# Patient Record
Sex: Female | Born: 1945 | Race: Black or African American | Hispanic: No | Marital: Married | Smoking: Never smoker
Health system: Southern US, Community
[De-identification: ages and names within clinical notes are randomized; demographics above are authoritative.]

## PROBLEM LIST (undated history)

## (undated) HISTORY — PX: BREAST SURGERY: SHX581

---

## 1985-05-21 DIAGNOSIS — C50919 Malignant neoplasm of unspecified site of unspecified female breast: Secondary | ICD-10-CM

## 1985-05-21 HISTORY — DX: Malignant neoplasm of unspecified site of unspecified female breast: C50.919

## 1985-05-21 HISTORY — PX: BREAST SURGERY: SHX581

## 2011-09-27 ENCOUNTER — Ambulatory Visit (INDEPENDENT_AMBULATORY_CARE_PROVIDER_SITE_OTHER): Payer: Medicare HMO | Admitting: Orthopedic Surgery

## 2011-09-27 ENCOUNTER — Other Ambulatory Visit: Payer: Self-pay | Admitting: Orthopedic Surgery

## 2011-09-27 ENCOUNTER — Encounter: Payer: Self-pay | Admitting: Orthopedic Surgery

## 2011-09-27 ENCOUNTER — Ambulatory Visit (HOSPITAL_COMMUNITY)
Admission: RE | Admit: 2011-09-27 | Discharge: 2011-09-27 | Disposition: A | Discharge status: Home / Self Care | Payer: Medicare HMO | Source: Ambulatory Visit | Attending: Orthopedic Surgery | Admitting: Orthopedic Surgery

## 2011-09-27 VITALS — BP 110/70 | Ht 63.0 in | Wt 142.0 lb

## 2011-09-27 DIAGNOSIS — M1710 Unilateral primary osteoarthritis, unspecified knee: Secondary | ICD-10-CM | POA: Insufficient documentation

## 2011-09-27 DIAGNOSIS — M25561 Pain in right knee: Secondary | ICD-10-CM

## 2011-09-27 DIAGNOSIS — M179 Osteoarthritis of knee, unspecified: Secondary | ICD-10-CM | POA: Insufficient documentation

## 2011-09-27 NOTE — Patient Instructions (Signed)
You have received a steroid shot. 15% of patients experience increased pain at the injection site with in the next 24 hours. This is best treated with ice and tylenol extra strength 2 tabs every 8 hours. If you are still having pain please call the office.    

## 2011-09-27 NOTE — Progress Notes (Signed)
  Subjective:    Donna Graham is a 66 y.o. female who presents  with knee pain involving the right knee. Onset was gradual, starting about 4 months ago. Inciting event: none known. Current symptoms include: stiffness and swelling. Pain is aggravated by inactivity. Patient has had no prior knee problems. Evaluation to date: none. Treatment to date: none.  The following portions of the patient's history were reviewed and updated as appropriate: allergies, current medications, past family history, past medical history, past social history, past surgical history and problem list.    ROS NEGATIVE EXCEPT FOR HEARTBURN  Review of Systems Pertinent items are noted in HPI.   Objective:    BP 110/70  Ht 5\' 3"  (1.6 m)  Wt 142 lb (64.411 kg)  BMI 25.15 kg/m2  Vital signs are stable as recorded  General appearance is normal  The patient is alert and oriented x3  The patient's mood and affect are normal  Gait assessment: NORMAL  The cardiovascular exam reveals normal pulses and temperature without edema swelling.  The lymphatic system is negative for palpable lymph nodes  The sensory exam is normal.  There are no pathologic reflexes.  Balance is normal.  SKIN NORMAL   Upper extremity exam  Inspection and palpation revealed no abnormalities in the upper extremities.  Range of motion is full without contracture.  Motor exam is normal with grade 5 strength.  The joints are fully reduced without subluxation.  There is no atrophy or tremor and muscle tone is normal.  All joints are stable.  Upper extremity exam  Inspection and palpation revealed no abnormalities in the upper extremities.  Range of motion is full without contracture.  Motor exam is normal with grade 5 strength.  The joints are fully reduced without subluxation.  There is no atrophy or tremor and muscle tone is normal.  All joints are stable.     Right knee: positive exam findings: crepitus, medial joint  line tenderness and tenderness noted medial, Joint line and negative exam findings: no effusion, no erythema, ACL stable, PCL stable, MCL stable, LCL stable, no patellar laxity, McMurray's negative and FROM  Left knee:  normal and no effusion, full active range of motion, no joint line tenderness, ligamentous structures intact.   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 Arthrocentesis. See procedure note. Plain film x-rays. Follow up in PRN .   Knee  Injection Procedure Note  Knee  Injection Procedure Note  Pre-operative Diagnosis: right knee oa  Post-operative Diagnosis: same  Indications: pain  Anesthesia: ethyl chloride   Procedure Details   Verbal consent was obtained for the procedure. Time out was completed.The joint was prepped with alcohol, followed by  Ethyl chloride spray and A 20 gauge needle was inserted into the knee via lateral approach; 4ml 1% lidocaine and 1 ml of depomedrol  was then injected into the joint . The needle was removed and the area cleansed and dressed.  Complications:  None; patient tolerated the procedure well.

## 2014-05-08 IMAGING — CR DG KNEE AP/LAT W/ SUNRISE*R*
3 series · 3 of 3 positions shown · non-contrast
Comparison: None

CLINICAL DATA: Medial right knee pain for 4 months

DG KNEE - 3 VIEWS

[view not recorded (1 of 3)]
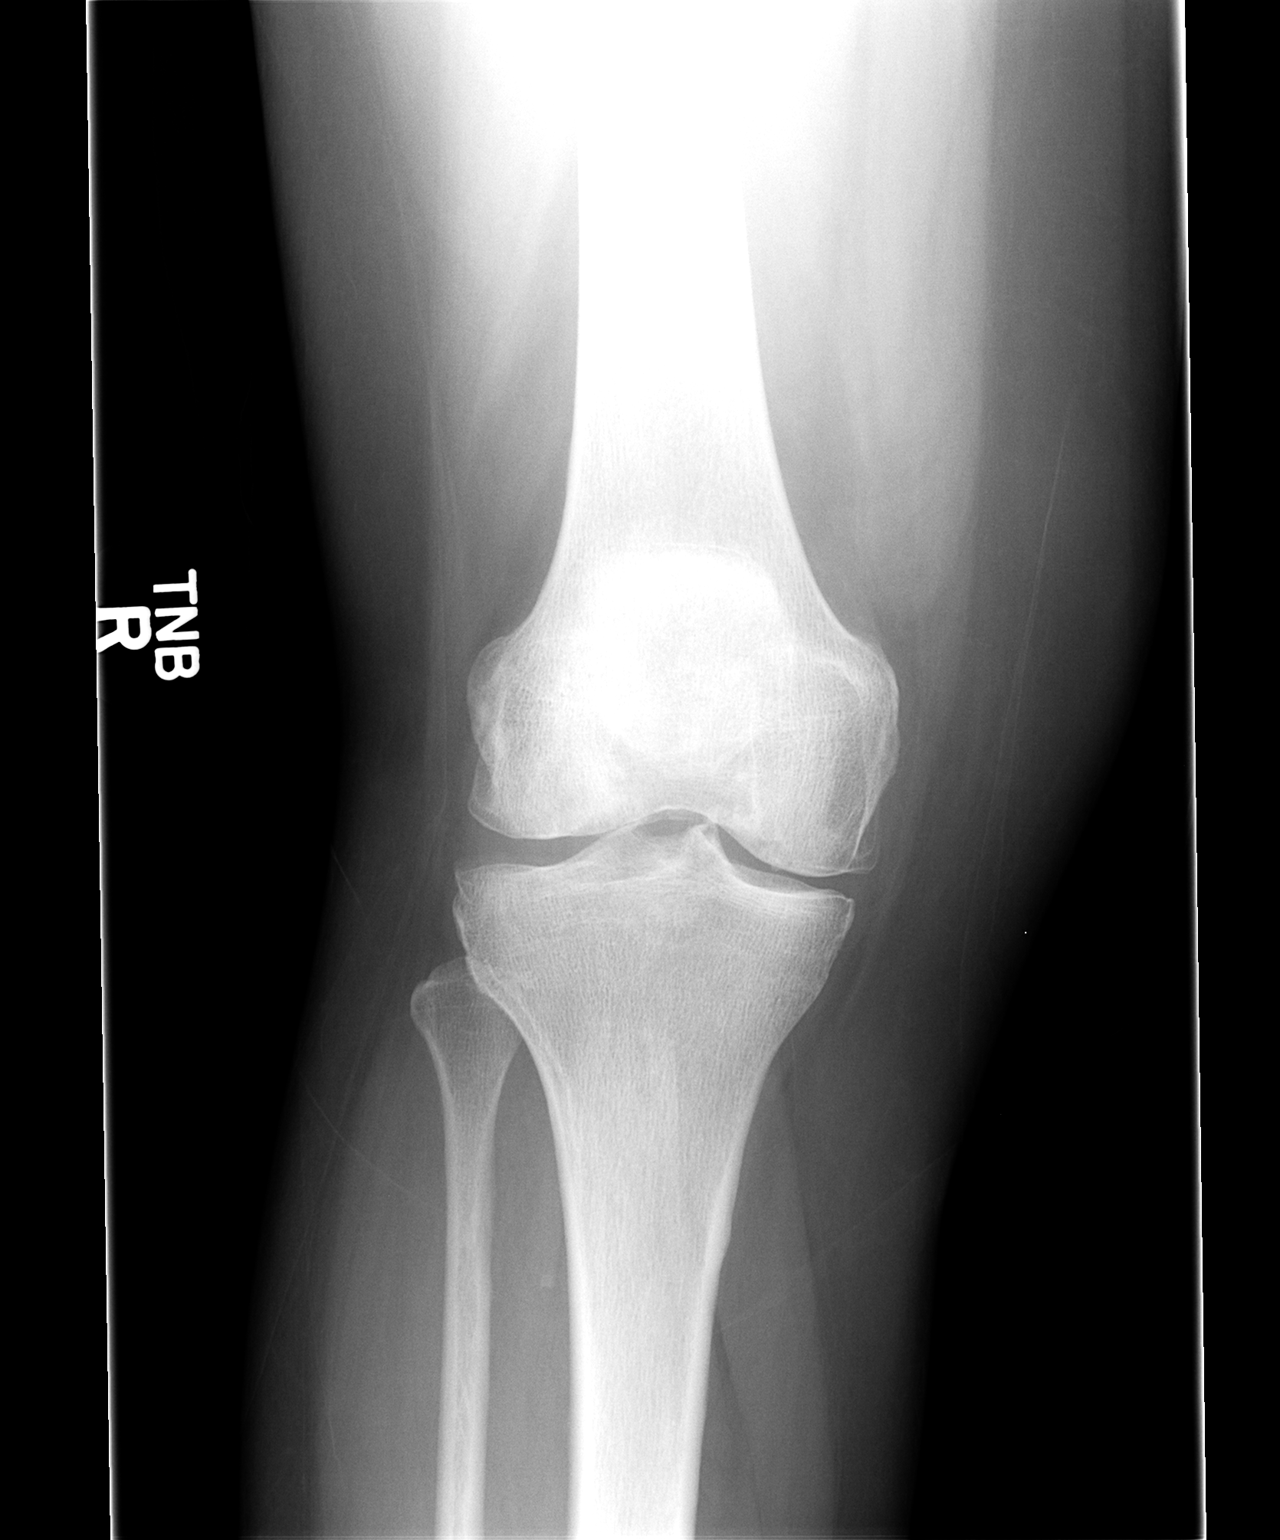

[view not recorded (2 of 3)]
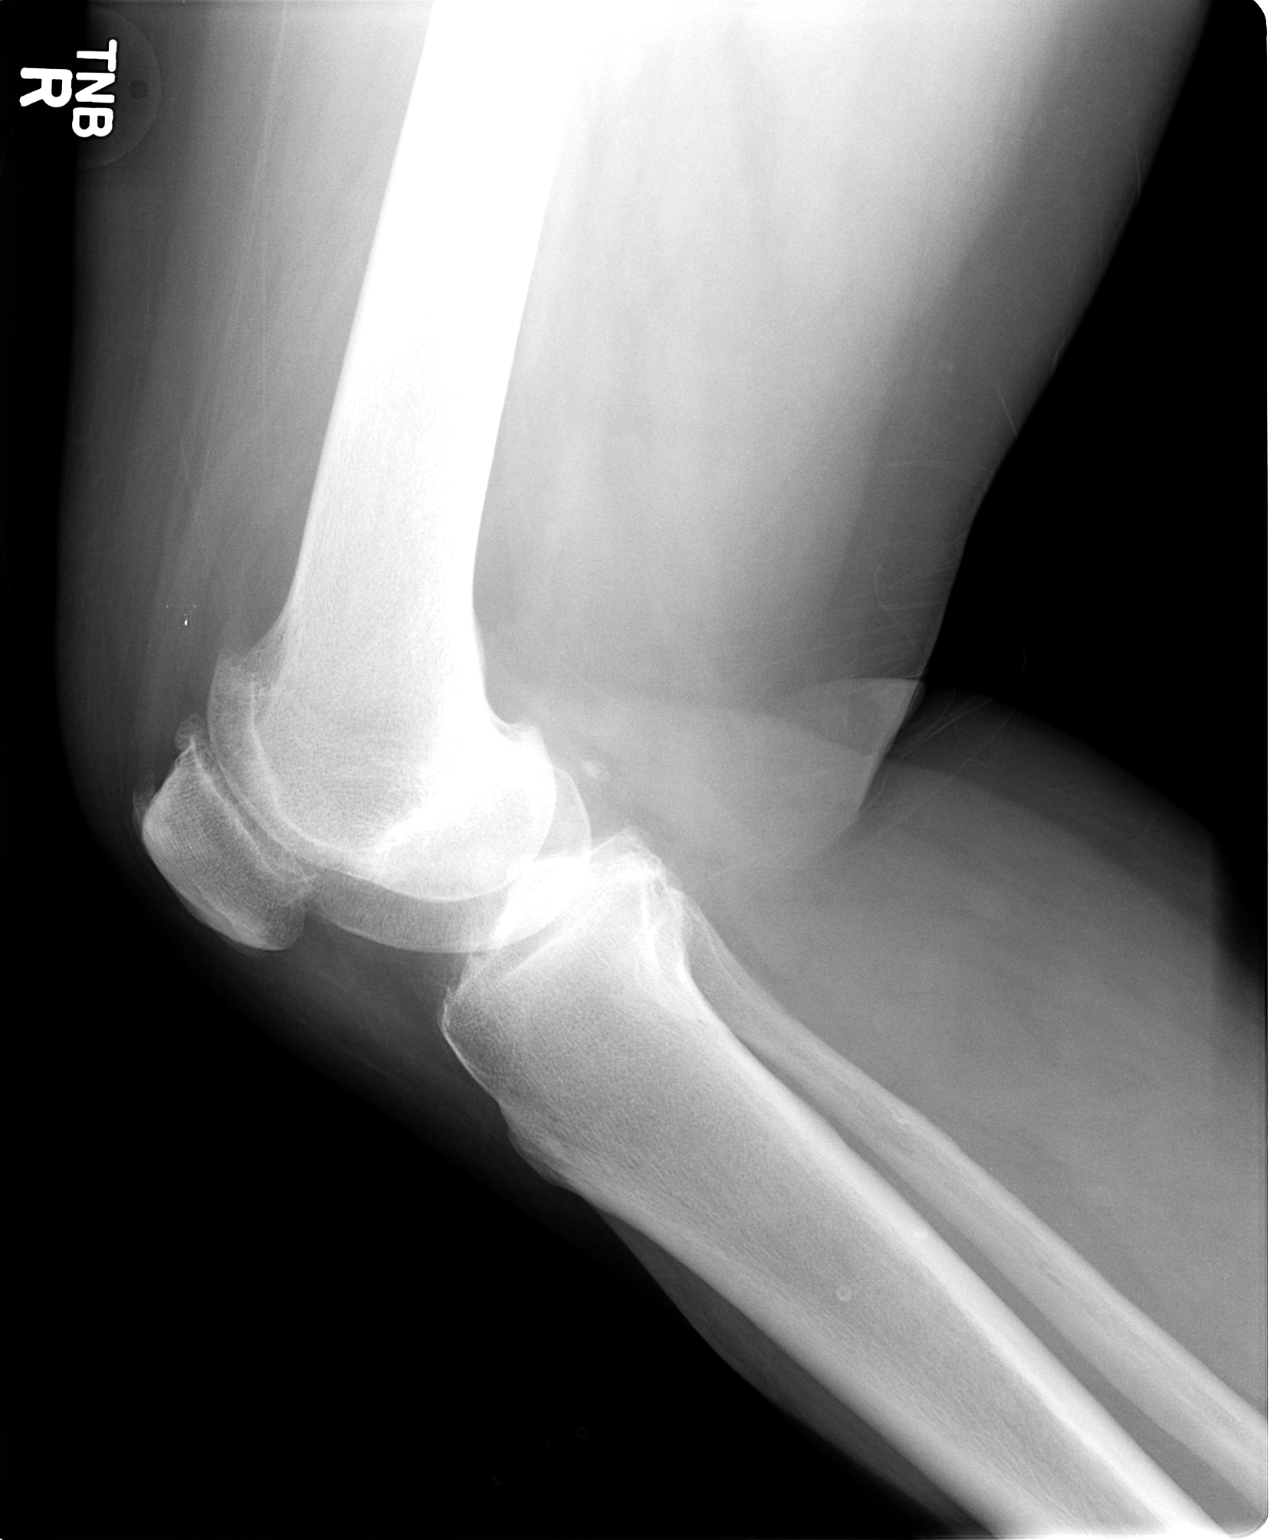

[view not recorded (3 of 3)]
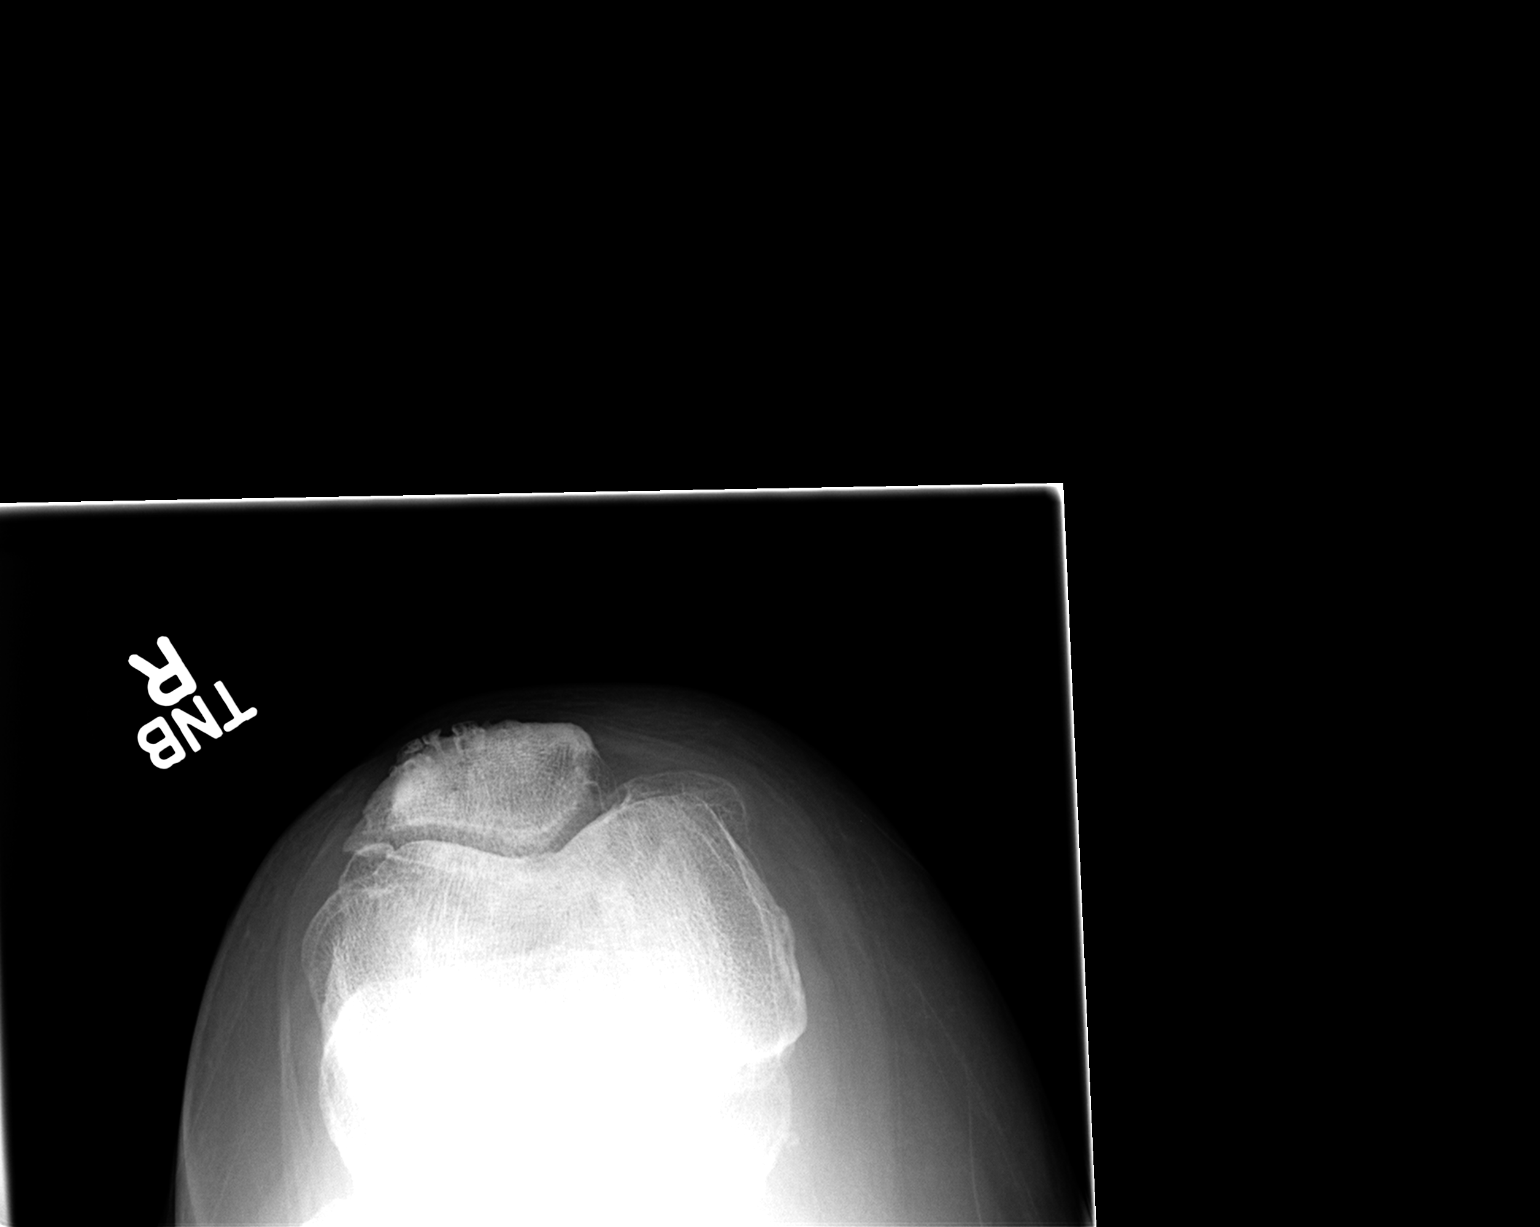

[3 of 3 positions shown; findings below may reference images not displayed]

FINDINGS: Osteoarthritic changes right knee greater at medial compartment and
patellofemoral joint than lateral compartment.
Joint space narrowing and marginal spur formation are seen.
No acute fracture, dislocation or bone destruction.
No knee joint effusion.
IMPRESSION: Tricompartmental osteoarthritic changes right knee.

## 2016-06-19 DIAGNOSIS — E2839 Other primary ovarian failure: Secondary | ICD-10-CM | POA: Diagnosis not present

## 2016-08-29 DIAGNOSIS — M778 Other enthesopathies, not elsewhere classified: Secondary | ICD-10-CM | POA: Diagnosis not present

## 2016-08-29 DIAGNOSIS — Z713 Dietary counseling and surveillance: Secondary | ICD-10-CM | POA: Diagnosis not present

## 2016-08-29 DIAGNOSIS — Z299 Encounter for prophylactic measures, unspecified: Secondary | ICD-10-CM | POA: Diagnosis not present

## 2016-08-29 DIAGNOSIS — Z6825 Body mass index (BMI) 25.0-25.9, adult: Secondary | ICD-10-CM | POA: Diagnosis not present

## 2016-08-29 DIAGNOSIS — Z2821 Immunization not carried out because of patient refusal: Secondary | ICD-10-CM | POA: Diagnosis not present

## 2016-08-29 DIAGNOSIS — M19031 Primary osteoarthritis, right wrist: Secondary | ICD-10-CM | POA: Diagnosis not present

## 2016-10-11 DIAGNOSIS — Z6825 Body mass index (BMI) 25.0-25.9, adult: Secondary | ICD-10-CM | POA: Diagnosis not present

## 2016-10-11 DIAGNOSIS — E559 Vitamin D deficiency, unspecified: Secondary | ICD-10-CM | POA: Diagnosis not present

## 2016-10-11 DIAGNOSIS — M19039 Primary osteoarthritis, unspecified wrist: Secondary | ICD-10-CM | POA: Diagnosis not present

## 2016-10-11 DIAGNOSIS — Z299 Encounter for prophylactic measures, unspecified: Secondary | ICD-10-CM | POA: Diagnosis not present

## 2016-10-11 DIAGNOSIS — E7800 Pure hypercholesterolemia, unspecified: Secondary | ICD-10-CM | POA: Diagnosis not present

## 2017-05-06 DIAGNOSIS — Z1231 Encounter for screening mammogram for malignant neoplasm of breast: Secondary | ICD-10-CM | POA: Diagnosis not present

## 2017-05-17 DIAGNOSIS — Z7189 Other specified counseling: Secondary | ICD-10-CM | POA: Diagnosis not present

## 2017-05-17 DIAGNOSIS — Z0000 Encounter for general adult medical examination without abnormal findings: Secondary | ICD-10-CM | POA: Diagnosis not present

## 2017-05-17 DIAGNOSIS — E7800 Pure hypercholesterolemia, unspecified: Secondary | ICD-10-CM | POA: Diagnosis not present

## 2017-05-17 DIAGNOSIS — Z299 Encounter for prophylactic measures, unspecified: Secondary | ICD-10-CM | POA: Diagnosis not present

## 2017-05-17 DIAGNOSIS — Z1339 Encounter for screening examination for other mental health and behavioral disorders: Secondary | ICD-10-CM | POA: Diagnosis not present

## 2017-05-17 DIAGNOSIS — Z1331 Encounter for screening for depression: Secondary | ICD-10-CM | POA: Diagnosis not present

## 2017-05-17 DIAGNOSIS — Z6827 Body mass index (BMI) 27.0-27.9, adult: Secondary | ICD-10-CM | POA: Diagnosis not present

## 2017-05-17 DIAGNOSIS — R5383 Other fatigue: Secondary | ICD-10-CM | POA: Diagnosis not present

## 2017-05-17 DIAGNOSIS — Z79899 Other long term (current) drug therapy: Secondary | ICD-10-CM | POA: Diagnosis not present

## 2017-05-17 DIAGNOSIS — Z1211 Encounter for screening for malignant neoplasm of colon: Secondary | ICD-10-CM | POA: Diagnosis not present

## 2017-05-20 DIAGNOSIS — E7800 Pure hypercholesterolemia, unspecified: Secondary | ICD-10-CM | POA: Diagnosis not present

## 2017-05-20 DIAGNOSIS — R5383 Other fatigue: Secondary | ICD-10-CM | POA: Diagnosis not present

## 2017-05-20 DIAGNOSIS — Z0000 Encounter for general adult medical examination without abnormal findings: Secondary | ICD-10-CM | POA: Diagnosis not present

## 2017-05-20 DIAGNOSIS — E559 Vitamin D deficiency, unspecified: Secondary | ICD-10-CM | POA: Diagnosis not present

## 2017-05-20 DIAGNOSIS — Z79899 Other long term (current) drug therapy: Secondary | ICD-10-CM | POA: Diagnosis not present

## 2017-06-26 DIAGNOSIS — E782 Mixed hyperlipidemia: Secondary | ICD-10-CM | POA: Diagnosis not present

## 2017-06-26 DIAGNOSIS — Z6826 Body mass index (BMI) 26.0-26.9, adult: Secondary | ICD-10-CM | POA: Diagnosis not present

## 2017-07-09 DIAGNOSIS — Z6826 Body mass index (BMI) 26.0-26.9, adult: Secondary | ICD-10-CM | POA: Diagnosis not present

## 2017-07-09 DIAGNOSIS — R05 Cough: Secondary | ICD-10-CM | POA: Diagnosis not present

## 2017-08-01 DIAGNOSIS — H6121 Impacted cerumen, right ear: Secondary | ICD-10-CM | POA: Diagnosis not present

## 2017-10-15 DIAGNOSIS — Z0100 Encounter for examination of eyes and vision without abnormal findings: Secondary | ICD-10-CM | POA: Diagnosis not present

## 2017-10-15 DIAGNOSIS — H40053 Ocular hypertension, bilateral: Secondary | ICD-10-CM | POA: Diagnosis not present

## 2018-03-31 DIAGNOSIS — R69 Illness, unspecified: Secondary | ICD-10-CM | POA: Diagnosis not present

## 2018-04-08 DIAGNOSIS — Z6828 Body mass index (BMI) 28.0-28.9, adult: Secondary | ICD-10-CM | POA: Diagnosis not present

## 2018-04-08 DIAGNOSIS — R202 Paresthesia of skin: Secondary | ICD-10-CM | POA: Diagnosis not present

## 2018-04-25 DIAGNOSIS — Z6828 Body mass index (BMI) 28.0-28.9, adult: Secondary | ICD-10-CM | POA: Diagnosis not present

## 2018-04-25 DIAGNOSIS — M25562 Pain in left knee: Secondary | ICD-10-CM | POA: Diagnosis not present

## 2018-05-08 DIAGNOSIS — Z1231 Encounter for screening mammogram for malignant neoplasm of breast: Secondary | ICD-10-CM | POA: Diagnosis not present

## 2018-05-30 DIAGNOSIS — R69 Illness, unspecified: Secondary | ICD-10-CM | POA: Diagnosis not present

## 2018-06-10 DIAGNOSIS — M1711 Unilateral primary osteoarthritis, right knee: Secondary | ICD-10-CM | POA: Diagnosis not present

## 2018-06-10 DIAGNOSIS — M1712 Unilateral primary osteoarthritis, left knee: Secondary | ICD-10-CM | POA: Diagnosis not present

## 2018-06-10 DIAGNOSIS — Z6827 Body mass index (BMI) 27.0-27.9, adult: Secondary | ICD-10-CM | POA: Diagnosis not present

## 2018-09-01 DIAGNOSIS — H00011 Hordeolum externum right upper eyelid: Secondary | ICD-10-CM | POA: Diagnosis not present

## 2018-09-17 DIAGNOSIS — G5790 Unspecified mononeuropathy of unspecified lower limb: Secondary | ICD-10-CM | POA: Diagnosis not present

## 2018-09-17 DIAGNOSIS — M79671 Pain in right foot: Secondary | ICD-10-CM | POA: Diagnosis not present

## 2018-09-17 DIAGNOSIS — G600 Hereditary motor and sensory neuropathy: Secondary | ICD-10-CM | POA: Diagnosis not present

## 2018-09-17 DIAGNOSIS — M792 Neuralgia and neuritis, unspecified: Secondary | ICD-10-CM | POA: Diagnosis not present

## 2018-09-17 DIAGNOSIS — M79672 Pain in left foot: Secondary | ICD-10-CM | POA: Diagnosis not present

## 2018-10-06 DIAGNOSIS — M1712 Unilateral primary osteoarthritis, left knee: Secondary | ICD-10-CM | POA: Diagnosis not present

## 2018-10-14 DIAGNOSIS — R5383 Other fatigue: Secondary | ICD-10-CM | POA: Diagnosis not present

## 2018-10-14 DIAGNOSIS — M1711 Unilateral primary osteoarthritis, right knee: Secondary | ICD-10-CM | POA: Diagnosis not present

## 2018-10-14 DIAGNOSIS — Z6827 Body mass index (BMI) 27.0-27.9, adult: Secondary | ICD-10-CM | POA: Diagnosis not present

## 2018-10-14 DIAGNOSIS — Z0001 Encounter for general adult medical examination with abnormal findings: Secondary | ICD-10-CM | POA: Diagnosis not present

## 2018-10-14 DIAGNOSIS — E782 Mixed hyperlipidemia: Secondary | ICD-10-CM | POA: Diagnosis not present

## 2018-10-14 DIAGNOSIS — Z1212 Encounter for screening for malignant neoplasm of rectum: Secondary | ICD-10-CM | POA: Diagnosis not present

## 2018-10-14 DIAGNOSIS — M1712 Unilateral primary osteoarthritis, left knee: Secondary | ICD-10-CM | POA: Diagnosis not present

## 2018-10-14 DIAGNOSIS — E039 Hypothyroidism, unspecified: Secondary | ICD-10-CM | POA: Diagnosis not present

## 2018-11-12 DIAGNOSIS — H0011 Chalazion right upper eyelid: Secondary | ICD-10-CM | POA: Diagnosis not present

## 2018-12-19 DIAGNOSIS — M1711 Unilateral primary osteoarthritis, right knee: Secondary | ICD-10-CM | POA: Diagnosis not present

## 2018-12-19 DIAGNOSIS — E782 Mixed hyperlipidemia: Secondary | ICD-10-CM | POA: Diagnosis not present

## 2019-01-19 DIAGNOSIS — I10 Essential (primary) hypertension: Secondary | ICD-10-CM | POA: Diagnosis not present

## 2019-01-19 DIAGNOSIS — E1165 Type 2 diabetes mellitus with hyperglycemia: Secondary | ICD-10-CM | POA: Diagnosis not present

## 2019-03-04 DIAGNOSIS — Z23 Encounter for immunization: Secondary | ICD-10-CM | POA: Diagnosis not present

## 2019-06-30 DIAGNOSIS — R69 Illness, unspecified: Secondary | ICD-10-CM | POA: Diagnosis not present

## 2019-08-19 DIAGNOSIS — I10 Essential (primary) hypertension: Secondary | ICD-10-CM | POA: Diagnosis not present

## 2019-08-19 DIAGNOSIS — E7849 Other hyperlipidemia: Secondary | ICD-10-CM | POA: Diagnosis not present

## 2019-09-03 DIAGNOSIS — Z6827 Body mass index (BMI) 27.0-27.9, adult: Secondary | ICD-10-CM | POA: Diagnosis not present

## 2019-09-03 DIAGNOSIS — H6690 Otitis media, unspecified, unspecified ear: Secondary | ICD-10-CM | POA: Diagnosis not present

## 2019-09-03 DIAGNOSIS — H6121 Impacted cerumen, right ear: Secondary | ICD-10-CM | POA: Diagnosis not present

## 2019-09-07 DIAGNOSIS — R69 Illness, unspecified: Secondary | ICD-10-CM | POA: Diagnosis not present

## 2019-09-25 DIAGNOSIS — Z9011 Acquired absence of right breast and nipple: Secondary | ICD-10-CM | POA: Diagnosis not present

## 2019-09-25 DIAGNOSIS — Z1231 Encounter for screening mammogram for malignant neoplasm of breast: Secondary | ICD-10-CM | POA: Diagnosis not present

## 2019-11-09 DIAGNOSIS — M1711 Unilateral primary osteoarthritis, right knee: Secondary | ICD-10-CM | POA: Diagnosis not present

## 2019-12-15 DIAGNOSIS — M7661 Achilles tendinitis, right leg: Secondary | ICD-10-CM | POA: Diagnosis not present

## 2019-12-15 DIAGNOSIS — M7731 Calcaneal spur, right foot: Secondary | ICD-10-CM | POA: Diagnosis not present

## 2019-12-15 DIAGNOSIS — M79671 Pain in right foot: Secondary | ICD-10-CM | POA: Diagnosis not present

## 2020-02-01 DIAGNOSIS — R5382 Chronic fatigue, unspecified: Secondary | ICD-10-CM | POA: Diagnosis not present

## 2020-02-01 DIAGNOSIS — R202 Paresthesia of skin: Secondary | ICD-10-CM | POA: Diagnosis not present

## 2020-02-01 DIAGNOSIS — E782 Mixed hyperlipidemia: Secondary | ICD-10-CM | POA: Diagnosis not present

## 2020-02-05 DIAGNOSIS — Z1212 Encounter for screening for malignant neoplasm of rectum: Secondary | ICD-10-CM | POA: Diagnosis not present

## 2020-02-05 DIAGNOSIS — Z0001 Encounter for general adult medical examination with abnormal findings: Secondary | ICD-10-CM | POA: Diagnosis not present

## 2020-02-05 DIAGNOSIS — M1711 Unilateral primary osteoarthritis, right knee: Secondary | ICD-10-CM | POA: Diagnosis not present

## 2020-02-05 DIAGNOSIS — Z6827 Body mass index (BMI) 27.0-27.9, adult: Secondary | ICD-10-CM | POA: Diagnosis not present

## 2020-02-05 DIAGNOSIS — E782 Mixed hyperlipidemia: Secondary | ICD-10-CM | POA: Diagnosis not present

## 2020-02-05 DIAGNOSIS — M1712 Unilateral primary osteoarthritis, left knee: Secondary | ICD-10-CM | POA: Diagnosis not present

## 2020-04-27 DIAGNOSIS — M9903 Segmental and somatic dysfunction of lumbar region: Secondary | ICD-10-CM | POA: Diagnosis not present

## 2020-04-27 DIAGNOSIS — S335XXA Sprain of ligaments of lumbar spine, initial encounter: Secondary | ICD-10-CM | POA: Diagnosis not present

## 2020-05-02 DIAGNOSIS — M9903 Segmental and somatic dysfunction of lumbar region: Secondary | ICD-10-CM | POA: Diagnosis not present

## 2020-05-02 DIAGNOSIS — S335XXA Sprain of ligaments of lumbar spine, initial encounter: Secondary | ICD-10-CM | POA: Diagnosis not present

## 2020-05-10 DIAGNOSIS — S335XXA Sprain of ligaments of lumbar spine, initial encounter: Secondary | ICD-10-CM | POA: Diagnosis not present

## 2020-05-10 DIAGNOSIS — M9903 Segmental and somatic dysfunction of lumbar region: Secondary | ICD-10-CM | POA: Diagnosis not present

## 2020-12-23 DIAGNOSIS — Z1231 Encounter for screening mammogram for malignant neoplasm of breast: Secondary | ICD-10-CM | POA: Diagnosis not present

## 2021-02-03 DIAGNOSIS — Z1329 Encounter for screening for other suspected endocrine disorder: Secondary | ICD-10-CM | POA: Diagnosis not present

## 2021-02-03 DIAGNOSIS — E782 Mixed hyperlipidemia: Secondary | ICD-10-CM | POA: Diagnosis not present

## 2021-02-03 DIAGNOSIS — R5382 Chronic fatigue, unspecified: Secondary | ICD-10-CM | POA: Diagnosis not present

## 2021-02-03 DIAGNOSIS — Z1159 Encounter for screening for other viral diseases: Secondary | ICD-10-CM | POA: Diagnosis not present

## 2021-02-03 DIAGNOSIS — E7849 Other hyperlipidemia: Secondary | ICD-10-CM | POA: Diagnosis not present

## 2021-02-08 DIAGNOSIS — R7301 Impaired fasting glucose: Secondary | ICD-10-CM | POA: Diagnosis not present

## 2021-02-08 DIAGNOSIS — Z0001 Encounter for general adult medical examination with abnormal findings: Secondary | ICD-10-CM | POA: Diagnosis not present

## 2021-02-08 DIAGNOSIS — Z6827 Body mass index (BMI) 27.0-27.9, adult: Secondary | ICD-10-CM | POA: Diagnosis not present

## 2021-02-08 DIAGNOSIS — M1711 Unilateral primary osteoarthritis, right knee: Secondary | ICD-10-CM | POA: Diagnosis not present

## 2021-02-08 DIAGNOSIS — M1712 Unilateral primary osteoarthritis, left knee: Secondary | ICD-10-CM | POA: Diagnosis not present

## 2021-02-08 DIAGNOSIS — E7849 Other hyperlipidemia: Secondary | ICD-10-CM | POA: Diagnosis not present

## 2021-02-15 DIAGNOSIS — J069 Acute upper respiratory infection, unspecified: Secondary | ICD-10-CM | POA: Diagnosis not present

## 2021-02-15 DIAGNOSIS — Z20828 Contact with and (suspected) exposure to other viral communicable diseases: Secondary | ICD-10-CM | POA: Diagnosis not present

## 2021-11-08 DIAGNOSIS — H6091 Unspecified otitis externa, right ear: Secondary | ICD-10-CM | POA: Diagnosis not present

## 2021-11-08 DIAGNOSIS — R030 Elevated blood-pressure reading, without diagnosis of hypertension: Secondary | ICD-10-CM | POA: Diagnosis not present

## 2021-12-25 DIAGNOSIS — Z1231 Encounter for screening mammogram for malignant neoplasm of breast: Secondary | ICD-10-CM | POA: Diagnosis not present

## 2022-01-05 ENCOUNTER — Other Ambulatory Visit: Payer: Self-pay | Admitting: Family Medicine

## 2022-01-05 DIAGNOSIS — R921 Mammographic calcification found on diagnostic imaging of breast: Secondary | ICD-10-CM | POA: Diagnosis not present

## 2022-01-05 DIAGNOSIS — R928 Other abnormal and inconclusive findings on diagnostic imaging of breast: Secondary | ICD-10-CM | POA: Diagnosis not present

## 2022-01-16 DIAGNOSIS — R921 Mammographic calcification found on diagnostic imaging of breast: Secondary | ICD-10-CM | POA: Diagnosis not present

## 2022-01-16 DIAGNOSIS — Z6827 Body mass index (BMI) 27.0-27.9, adult: Secondary | ICD-10-CM | POA: Diagnosis not present

## 2022-01-18 ENCOUNTER — Ambulatory Visit
Admission: RE | Admit: 2022-01-18 | Discharge: 2022-01-18 | Disposition: A | Discharge status: Home / Self Care | Payer: Medicare HMO | Source: Ambulatory Visit | Attending: Family Medicine | Admitting: Family Medicine

## 2022-01-18 DIAGNOSIS — R928 Other abnormal and inconclusive findings on diagnostic imaging of breast: Secondary | ICD-10-CM

## 2022-01-18 DIAGNOSIS — R921 Mammographic calcification found on diagnostic imaging of breast: Secondary | ICD-10-CM

## 2022-01-18 DIAGNOSIS — D0512 Intraductal carcinoma in situ of left breast: Secondary | ICD-10-CM | POA: Diagnosis not present

## 2022-01-23 ENCOUNTER — Other Ambulatory Visit: Payer: Self-pay | Admitting: General Surgery

## 2022-01-23 DIAGNOSIS — I890 Lymphedema, not elsewhere classified: Secondary | ICD-10-CM | POA: Diagnosis not present

## 2022-01-23 DIAGNOSIS — Z170 Estrogen receptor positive status [ER+]: Secondary | ICD-10-CM | POA: Diagnosis not present

## 2022-01-23 DIAGNOSIS — C50212 Malignant neoplasm of upper-inner quadrant of left female breast: Secondary | ICD-10-CM | POA: Diagnosis not present

## 2022-01-23 DIAGNOSIS — Z853 Personal history of malignant neoplasm of breast: Secondary | ICD-10-CM | POA: Diagnosis not present

## 2022-01-31 ENCOUNTER — Telehealth: Payer: Self-pay | Admitting: Hematology and Oncology

## 2022-01-31 NOTE — Telephone Encounter (Signed)
Called pt to sch appt per 9/13 staff msg. No answer. Left msg for pt to call back to sch appt.

## 2022-02-01 ENCOUNTER — Telehealth: Payer: Self-pay | Admitting: Hematology and Oncology

## 2022-02-01 NOTE — Telephone Encounter (Signed)
Scheduled appt per 9/12 referral. Pt is aware of appt date and time. Pt is aware to arrive 15 mins prior to appt time and to bring and updated insurance card. Pt is aware of appt location.   

## 2022-02-05 ENCOUNTER — Other Ambulatory Visit: Payer: Self-pay

## 2022-02-05 ENCOUNTER — Encounter (HOSPITAL_COMMUNITY): Payer: Self-pay | Admitting: General Surgery

## 2022-02-05 NOTE — Progress Notes (Signed)
PCP - Dr. Gar Ponto at [facility] in [place] Cardiologist - Denies EKG -  Chest x-ray -  ECHO - Denies Cardiac Cath - Denies CPAP - Denies DM - Denies Blood Thinner Instructions: Denies Aspirin Instructions: Denies  Anesthesia review: No  -------------  SDW INSTRUCTIONS:  Your procedure is scheduled on Tuesday September 19th . Please report to [facility] Main Entrance "A" at 1230 A.M., and check in at the Admitting office. Call this number if you have problems the morning of surgery: [phone]   Remember: Do not eat  after midnight the night before your surgery  You may drink clear liquids until 1200 the morning of your surgery.   Clear liquids allowed are: Water, Non-Citrus Juices (without pulp), Carbonated Beverages, Clear Tea, Black Coffee Only, and Gatorade   Medications to take morning of surgery with a sip of water include: NONE  As of today, STOP taking any Aspirin (unless otherwise instructed by your surgeon), Aleve, Naproxen, Ibuprofen, Motrin, Advil, Goody's, BC's, all herbal medications, fish oil, and all vitamins.    The Morning of Surgery Do not wear jewelry, make-up or nail polish. Do not wear lotions, powders, or perfumes/colognes, or deodorant Do not bring valuables to the hospital. [facility] is not responsible for any belongings or valuables.    Remember that you must have someone to transport you home after your surgery, and remain with you for 24 hours if you are discharged the same day.  Please bring cases for contacts, glasses, hearing aids, dentures or bridgework because it cannot be worn into surgery.   Patients discharged the day of surgery will not be allowed to drive home.   Please shower the NIGHT BEFORE/MORNING OF SURGERY (use antibacterial soap like DIAL soap if possible). Wear comfortable clothes the morning of surgery. Oral Hygiene is also important to reduce your risk of infection.  Remember - BRUSH YOUR TEETH THE MORNING OF SURGERY  WITH YOUR REGULAR TOOTHPASTE  Patient denies shortness of breath, fever, cough and chest pain.

## 2022-02-05 NOTE — H&P (Signed)
REFERRING PHYSICIAN:  Quillian Quince   PROVIDER:  Georgianne Fick, MD   Care Team: Patient Care Team: Glenda Chroman, MD as PCP - General (Internal Medicine)    MRN: [id number] DOB: [birth year] DATE OF ENCOUNTER: 01/23/2022   Subjective    Chief Complaint: New Consultation (Left breast cancer)       History of Present Illness: Donna Graham is a 76 y.o. female who is seen today as an office consultation at the request of Dr. Quillian Quince for evaluation of New Consultation (Left breast cancer)   Patient is with a new diagnosis of left breast cancer August/2023.  She had screening detected left breast calcifications.  There was a 2 cm group of indeterminate calcifications in the upper inner quadrant on the left.  Core needle biopsy was performed and this showed intermediate grade DCIS, ER and PR positive.   Of note she has a history of right breast cancer that was taking care of in the 80s.  She got a mastectomy and an implant.  She knows that she received radiation, but she did not take antihormonal therapy or chemotherapy.   She is overall healthy and does not take medications other than vitamins.  She gets around well and has lots of family that lives nearby.       Family cancer history: mother and sister have had breast cancer     Diagnostic mammogram:01/05/2022 [facility]   ACR Breast Density Category b: There are scattered areas of fibroglandular density. FINDINGS: There is a 2 cm group of amorphous and fine pleomorphic calcifications in a linear distribution in the upper inner quadrant of the left breast. They are best seen on the CC projection. These have an indeterminate morphology. Indeterminate 2 cm group of left breast calcifications. Recommend stereotactic biopsy. RECOMMENDATION: Stereotactic biopsy of the left breast. I have discussed the findings and recommendations with the patient. If applicable, a reminder letter will be sent to the patient regarding the next  appointment.  BI-RADS CATEGORY  4: Suspicious.    Pathology core needle biopsy:  01/18/2022 [facility], left, needle core biopsy, UIQ, X clip - DUCTAL CARCINOMA IN SITU, INTERMEDIATE GRADE - NECROSIS: PRESENT, FOCAL - CALCIFICATIONS: PRESENT - DCIS LENGTH: 0.5 CM   Receptors: Estrogen receptor:  100% positive, strong staining Progesterone receptor: 100% positive, strong staining   Review of Systems: A complete review of systems was obtained from the patient.  I have reviewed this information and discussed as appropriate with the patient.  See HPI as well for other ROS.     Medical History: Past Medical History      Past Medical History:  Diagnosis Date   History of cancer             Patient Active Problem List  Diagnosis   Malignant neoplasm of upper-inner quadrant of left breast in female, estrogen receptor positive (CMS-HCC)   History of right breast cancer      Past Surgical History  History reviewed. No pertinent surgical history.      Allergies  No Known Allergies           Current Outpatient Medications on File Prior to Visit  Medication Sig Dispense Refill   multivitamin capsule Take 1 capsule by mouth once daily        No current facility-administered medications on file prior to visit.      Family History       Family History  Problem Relation Age of Onset   Breast cancer  Mother     Breast cancer Sister          Social History       Tobacco Use  Smoking Status Never  Smokeless Tobacco Never      Social History  Social History        Socioeconomic History   Marital status: Married  Tobacco Use   Smoking status: Never   Smokeless tobacco: Never  Vaping Use   Vaping Use: Never used  Substance and Sexual Activity   Alcohol use: Not Currently   Drug use: Never        Objective:         Vitals:    01/23/22 1520  BP: (!) 150/78  Pulse: 79  Temp: 36.6 C (97.9 F)  SpO2: 98%  Weight: 67.5 kg (148 lb 12.8 oz)  Height: 160  cm ('5\' 3"'$ )    Body mass index is 26.36 kg/m.   Gen:  No acute distress.  Well nourished and well groomed.   Neurological: Alert and oriented to person, place, and time. Coordination normal.  Head: Normocephalic and atraumatic.  Eyes: Conjunctivae are normal. Pupils are equal, round, and reactive to light. No scleral icterus.  Neck: Normal range of motion. Neck supple. No tracheal deviation or thyromegaly present.  Cardiovascular: Normal rate, regular rhythm, normal heart sounds and intact distal pulses.  Exam reveals no gallop and no friction rub.  No murmur heard. Breast: Right breast surgically absent with implant.  No palpable masses over this and no lymphadenopathy present.  There is no arm lymphedema and good range of motion on the right.  Left breast has no significant bruising and no palpable masses.  There is no nipple retraction or nipple discharge.  She has no skin dimpling or alteration of left breast contour.  Left axilla is negative as well. Respiratory: Effort normal.  No respiratory distress. No chest wall tenderness. Breath sounds normal.  No wheezes, rales or rhonchi.  GI: Soft. Bowel sounds are normal. The abdomen is soft and nontender.  There is no rebound and no guarding.  Musculoskeletal: Normal range of motion. Extremities are nontender.  Lymphadenopathy: No cervical, preauricular, postauricular or axillary adenopathy is present Skin: Skin is warm and dry. No rash noted. No diaphoresis. No erythema. No pallor. No clubbing, cyanosis, or edema.   Psychiatric: Normal mood and affect. Behavior is normal. Judgment and thought content normal.      Labs None avail   Assessment and Plan:        ICD-10-CM    1. Malignant neoplasm of upper-inner quadrant of left breast in female, estrogen receptor positive (CMS-HCC)  C50.212      Z17.0       2. History of right breast cancer  Z85.3         Surgical options were discussed with the patient's new diagnosis of clinical Tis  left breast cancer.  She is certainly candidate for a lumpectomy, but the patient desires mastectomy.  She declines referral to discuss reconstruction.   I reviewed mastectomy with the patient.  I discussed that she would get a preoperative block and then be put to sleep.  I will inject her with mag trace in the event that invasive cancer is found.  I discussed the incision and the boundaries of the breast.  I reviewed risks of surgery including bleeding, infection, chronic pain, dissatisfaction with the scar, discomfort, decreased range of motion, numbness or tingling in her chest wall or arm, heart or lung  complications, blood clot, others.  I discussed recovery and drain placement.  She does remember the drain from before and with several of her other relatives.   She would like to do this at the first available opportunity.     Pt desires oncology referral in [place].

## 2022-02-06 ENCOUNTER — Ambulatory Visit (HOSPITAL_COMMUNITY): Payer: Medicare HMO | Admitting: Anesthesiology

## 2022-02-06 ENCOUNTER — Ambulatory Visit (HOSPITAL_COMMUNITY)
Admission: RE | Admit: 2022-02-06 | Discharge: 2022-02-07 | Disposition: A | Discharge status: Home / Self Care | Payer: Medicare HMO | Attending: General Surgery | Admitting: General Surgery

## 2022-02-06 ENCOUNTER — Encounter (HOSPITAL_COMMUNITY): Payer: Self-pay | Admitting: General Surgery

## 2022-02-06 ENCOUNTER — Ambulatory Visit (HOSPITAL_BASED_OUTPATIENT_CLINIC_OR_DEPARTMENT_OTHER): Payer: Medicare HMO | Admitting: Anesthesiology

## 2022-02-06 ENCOUNTER — Encounter (HOSPITAL_COMMUNITY)
Admission: RE | Disposition: A | Discharge status: Home / Self Care | Payer: Self-pay | Source: Home / Self Care | Attending: General Surgery

## 2022-02-06 ENCOUNTER — Other Ambulatory Visit: Payer: Self-pay

## 2022-02-06 DIAGNOSIS — D0512 Intraductal carcinoma in situ of left breast: Secondary | ICD-10-CM

## 2022-02-06 DIAGNOSIS — N6012 Diffuse cystic mastopathy of left breast: Secondary | ICD-10-CM | POA: Diagnosis not present

## 2022-02-06 DIAGNOSIS — Z803 Family history of malignant neoplasm of breast: Secondary | ICD-10-CM | POA: Insufficient documentation

## 2022-02-06 DIAGNOSIS — I10 Essential (primary) hypertension: Secondary | ICD-10-CM | POA: Insufficient documentation

## 2022-02-06 DIAGNOSIS — Z9011 Acquired absence of right breast and nipple: Secondary | ICD-10-CM | POA: Insufficient documentation

## 2022-02-06 DIAGNOSIS — C50212 Malignant neoplasm of upper-inner quadrant of left female breast: Secondary | ICD-10-CM | POA: Diagnosis present

## 2022-02-06 DIAGNOSIS — R920 Mammographic microcalcification found on diagnostic imaging of breast: Secondary | ICD-10-CM | POA: Diagnosis not present

## 2022-02-06 DIAGNOSIS — G8918 Other acute postprocedural pain: Secondary | ICD-10-CM | POA: Diagnosis not present

## 2022-02-06 DIAGNOSIS — Z923 Personal history of irradiation: Secondary | ICD-10-CM | POA: Insufficient documentation

## 2022-02-06 DIAGNOSIS — N6489 Other specified disorders of breast: Secondary | ICD-10-CM | POA: Diagnosis not present

## 2022-02-06 DIAGNOSIS — Z853 Personal history of malignant neoplasm of breast: Secondary | ICD-10-CM | POA: Diagnosis not present

## 2022-02-06 DIAGNOSIS — Z170 Estrogen receptor positive status [ER+]: Secondary | ICD-10-CM | POA: Insufficient documentation

## 2022-02-06 DIAGNOSIS — N62 Hypertrophy of breast: Secondary | ICD-10-CM | POA: Diagnosis not present

## 2022-02-06 HISTORY — PX: TOTAL MASTECTOMY: SHX6129

## 2022-02-06 SURGERY — MASTECTOMY, SIMPLE
Anesthesia: Regional | Site: Breast | Laterality: Left

## 2022-02-06 MED ORDER — TRAMADOL HCL 50 MG PO TABS: 50.0000 mg | ORAL_TABLET | Freq: Four times a day (QID) | ORAL | Status: DC | PRN

## 2022-02-06 MED ORDER — DEXAMETHASONE SODIUM PHOSPHATE 10 MG/ML IJ SOLN
INTRAMUSCULAR | Status: DC | PRN
  Administered 2022-02-06: 5 mg via INTRAVENOUS

## 2022-02-06 MED ORDER — LACTATED RINGERS IV SOLN: INTRAVENOUS | Status: DC

## 2022-02-06 MED ORDER — ACETAMINOPHEN 500 MG PO TABS: 1000.0000 mg | ORAL_TABLET | Freq: Once | ORAL | Status: DC

## 2022-02-06 MED ORDER — ONDANSETRON HCL 4 MG/2ML IJ SOLN: 4.0000 mg | Freq: Four times a day (QID) | INTRAMUSCULAR | Status: DC | PRN

## 2022-02-06 MED ORDER — PROPOFOL 10 MG/ML IV BOLUS
INTRAVENOUS | Status: DC | PRN
  Administered 2022-02-06: 100 mg via INTRAVENOUS

## 2022-02-06 MED ORDER — MELATONIN 3 MG PO TABS: 3.0000 mg | ORAL_TABLET | Freq: Every evening | ORAL | Status: DC | PRN

## 2022-02-06 MED ORDER — ACETAMINOPHEN 325 MG PO TABS: 650.0000 mg | ORAL_TABLET | Freq: Four times a day (QID) | ORAL | Status: DC | PRN

## 2022-02-06 MED ORDER — OXYCODONE HCL 5 MG PO TABS: 5.0000 mg | ORAL_TABLET | Freq: Once | ORAL | Status: DC | PRN

## 2022-02-06 MED ORDER — BUPIVACAINE-EPINEPHRINE (PF) 0.25% -1:200000 IJ SOLN
INTRAMUSCULAR | Status: AC
  Filled 2022-02-06: qty 30

## 2022-02-06 MED ORDER — FENTANYL CITRATE (PF) 250 MCG/5ML IJ SOLN
INTRAMUSCULAR | Status: AC
  Filled 2022-02-06: qty 5

## 2022-02-06 MED ORDER — CELECOXIB 200 MG PO CAPS
200.0000 mg | ORAL_CAPSULE | Freq: Two times a day (BID) | ORAL | Status: DC
  Administered 2022-02-07 (×2): 200 mg via ORAL
  Filled 2022-02-06 (×2): qty 1

## 2022-02-06 MED ORDER — ACETAMINOPHEN 500 MG PO TABS: 1000.0000 mg | ORAL_TABLET | ORAL | Status: AC

## 2022-02-06 MED ORDER — CEFAZOLIN SODIUM-DEXTROSE 2-4 GM/100ML-% IV SOLN
INTRAVENOUS | Status: AC
  Filled 2022-02-06: qty 100

## 2022-02-06 MED ORDER — DIPHENHYDRAMINE HCL 12.5 MG/5ML PO ELIX: 12.5000 mg | ORAL_SOLUTION | Freq: Four times a day (QID) | ORAL | Status: DC | PRN

## 2022-02-06 MED ORDER — PHENYLEPHRINE 80 MCG/ML (10ML) SYRINGE FOR IV PUSH (FOR BLOOD PRESSURE SUPPORT)
PREFILLED_SYRINGE | INTRAVENOUS | Status: AC
  Filled 2022-02-06: qty 10

## 2022-02-06 MED ORDER — SENNA 8.6 MG PO TABS
1.0000 | ORAL_TABLET | Freq: Two times a day (BID) | ORAL | Status: DC
  Administered 2022-02-07 (×2): 8.6 mg via ORAL
  Filled 2022-02-06: qty 1

## 2022-02-06 MED ORDER — PHENYLEPHRINE 80 MCG/ML (10ML) SYRINGE FOR IV PUSH (FOR BLOOD PRESSURE SUPPORT)
PREFILLED_SYRINGE | INTRAVENOUS | Status: DC | PRN
  Administered 2022-02-06 (×4): 80 ug via INTRAVENOUS

## 2022-02-06 MED ORDER — ROCURONIUM BROMIDE 10 MG/ML (PF) SYRINGE
PREFILLED_SYRINGE | INTRAVENOUS | Status: DC | PRN
  Administered 2022-02-06: 100 mg via INTRAVENOUS

## 2022-02-06 MED ORDER — SENNA 8.6 MG PO TABS
ORAL_TABLET | ORAL | Status: AC
  Filled 2022-02-06: qty 1

## 2022-02-06 MED ORDER — AMISULPRIDE (ANTIEMETIC) 5 MG/2ML IV SOLN: 10.0000 mg | Freq: Once | INTRAVENOUS | Status: DC | PRN

## 2022-02-06 MED ORDER — FENTANYL CITRATE (PF) 100 MCG/2ML IJ SOLN: 50.0000 ug | Freq: Once | INTRAMUSCULAR | Status: AC

## 2022-02-06 MED ORDER — LIDOCAINE 2% (20 MG/ML) 5 ML SYRINGE
INTRAMUSCULAR | Status: AC
  Filled 2022-02-06: qty 5

## 2022-02-06 MED ORDER — HYDROMORPHONE HCL 1 MG/ML IJ SOLN: 0.2500 mg | INTRAMUSCULAR | Status: DC | PRN

## 2022-02-06 MED ORDER — FENTANYL CITRATE (PF) 100 MCG/2ML IJ SOLN
INTRAMUSCULAR | Status: AC
  Administered 2022-02-06: 50 ug via INTRAVENOUS
  Filled 2022-02-06: qty 2

## 2022-02-06 MED ORDER — SODIUM CHLORIDE (PF) 0.9 % IJ SOLN
INTRAMUSCULAR | Status: AC
  Filled 2022-02-06: qty 10

## 2022-02-06 MED ORDER — OXYCODONE HCL 5 MG PO TABS: 5.0000 mg | ORAL_TABLET | ORAL | Status: DC | PRN

## 2022-02-06 MED ORDER — OXYCODONE HCL 5 MG/5ML PO SOLN: 5.0000 mg | Freq: Once | ORAL | Status: DC | PRN

## 2022-02-06 MED ORDER — SUGAMMADEX SODIUM 200 MG/2ML IV SOLN
INTRAVENOUS | Status: DC | PRN
  Administered 2022-02-06: 140 mg via INTRAVENOUS

## 2022-02-06 MED ORDER — ONDANSETRON 4 MG PO TBDP: 4.0000 mg | ORAL_TABLET | Freq: Four times a day (QID) | ORAL | Status: DC | PRN

## 2022-02-06 MED ORDER — ACETAMINOPHEN 650 MG RE SUPP: 650.0000 mg | Freq: Four times a day (QID) | RECTAL | Status: DC | PRN

## 2022-02-06 MED ORDER — CEFAZOLIN SODIUM-DEXTROSE 2-4 GM/100ML-% IV SOLN
2.0000 g | Freq: Three times a day (TID) | INTRAVENOUS | Status: AC
  Administered 2022-02-07: 2 g via INTRAVENOUS
  Filled 2022-02-06: qty 100

## 2022-02-06 MED ORDER — ROPIVACAINE HCL 5 MG/ML IJ SOLN
INTRAMUSCULAR | Status: DC | PRN
  Administered 2022-02-06: 20 mL via PERINEURAL

## 2022-02-06 MED ORDER — CHLORHEXIDINE GLUCONATE CLOTH 2 % EX PADS: 6.0000 | MEDICATED_PAD | Freq: Once | CUTANEOUS | Status: DC

## 2022-02-06 MED ORDER — ROCURONIUM BROMIDE 10 MG/ML (PF) SYRINGE
PREFILLED_SYRINGE | INTRAVENOUS | Status: AC
  Filled 2022-02-06: qty 10

## 2022-02-06 MED ORDER — ONDANSETRON HCL 4 MG/2ML IJ SOLN
INTRAMUSCULAR | Status: DC | PRN
  Administered 2022-02-06: 4 mg via INTRAVENOUS

## 2022-02-06 MED ORDER — METHOCARBAMOL 500 MG PO TABS: 500.0000 mg | ORAL_TABLET | Freq: Four times a day (QID) | ORAL | Status: DC | PRN

## 2022-02-06 MED ORDER — MIDAZOLAM HCL 2 MG/2ML IJ SOLN
INTRAMUSCULAR | Status: AC
  Filled 2022-02-06: qty 2

## 2022-02-06 MED ORDER — 0.9 % SODIUM CHLORIDE (POUR BTL) OPTIME
TOPICAL | Status: DC | PRN
  Administered 2022-02-06: 1000 mL

## 2022-02-06 MED ORDER — HYDRALAZINE HCL 20 MG/ML IJ SOLN
INTRAMUSCULAR | Status: AC
  Filled 2022-02-06: qty 1

## 2022-02-06 MED ORDER — MAGTRACE LYMPHATIC TRACER
INTRAMUSCULAR | Status: DC | PRN
  Administered 2022-02-06: 2 mL via INTRAMUSCULAR

## 2022-02-06 MED ORDER — ONDANSETRON HCL 4 MG/2ML IJ SOLN
INTRAMUSCULAR | Status: AC
  Filled 2022-02-06: qty 2

## 2022-02-06 MED ORDER — CHLORHEXIDINE GLUCONATE 0.12 % MT SOLN
OROMUCOSAL | Status: AC
  Administered 2022-02-06: 15 mL
  Filled 2022-02-06: qty 15

## 2022-02-06 MED ORDER — PROCHLORPERAZINE MALEATE 10 MG PO TABS: 10.0000 mg | ORAL_TABLET | Freq: Four times a day (QID) | ORAL | Status: DC | PRN

## 2022-02-06 MED ORDER — FENTANYL CITRATE (PF) 250 MCG/5ML IJ SOLN
INTRAMUSCULAR | Status: DC | PRN
  Administered 2022-02-06: 50 ug via INTRAVENOUS

## 2022-02-06 MED ORDER — ONDANSETRON HCL 4 MG/2ML IJ SOLN: 4.0000 mg | Freq: Once | INTRAMUSCULAR | Status: DC | PRN

## 2022-02-06 MED ORDER — LABETALOL HCL 5 MG/ML IV SOLN
INTRAVENOUS | Status: AC
  Filled 2022-02-06: qty 4

## 2022-02-06 MED ORDER — LABETALOL HCL 5 MG/ML IV SOLN
INTRAVENOUS | Status: DC | PRN
  Administered 2022-02-06 (×2): 5 mg via INTRAVENOUS

## 2022-02-06 MED ORDER — DIPHENHYDRAMINE HCL 50 MG/ML IJ SOLN: 12.5000 mg | Freq: Four times a day (QID) | INTRAMUSCULAR | Status: DC | PRN

## 2022-02-06 MED ORDER — ORAL CARE MOUTH RINSE: 15.0000 mL | Freq: Once | OROMUCOSAL | Status: AC

## 2022-02-06 MED ORDER — ACETAMINOPHEN 500 MG PO TABS
ORAL_TABLET | ORAL | Status: AC
  Administered 2022-02-06: 1000 mg via ORAL
  Filled 2022-02-06: qty 2

## 2022-02-06 MED ORDER — LIDOCAINE HCL (PF) 1 % IJ SOLN
INTRAMUSCULAR | Status: AC
  Filled 2022-02-06: qty 30

## 2022-02-06 MED ORDER — METOPROLOL TARTRATE 5 MG/5ML IV SOLN
5.0000 mg | Freq: Four times a day (QID) | INTRAVENOUS | Status: DC | PRN
  Administered 2022-02-07: 5 mg via INTRAVENOUS
  Filled 2022-02-06: qty 5

## 2022-02-06 MED ORDER — BUPIVACAINE LIPOSOME 1.3 % IJ SUSP
INTRAMUSCULAR | Status: DC | PRN
  Administered 2022-02-06: 10 mL via PERINEURAL

## 2022-02-06 MED ORDER — DEXAMETHASONE SODIUM PHOSPHATE 10 MG/ML IJ SOLN
INTRAMUSCULAR | Status: AC
  Filled 2022-02-06: qty 1

## 2022-02-06 MED ORDER — PROCHLORPERAZINE EDISYLATE 10 MG/2ML IJ SOLN: 5.0000 mg | Freq: Four times a day (QID) | INTRAMUSCULAR | Status: DC | PRN

## 2022-02-06 MED ORDER — CEFAZOLIN SODIUM-DEXTROSE 2-4 GM/100ML-% IV SOLN
2.0000 g | INTRAVENOUS | Status: AC
  Administered 2022-02-06: 2 g via INTRAVENOUS

## 2022-02-06 MED ORDER — MORPHINE SULFATE (PF) 2 MG/ML IV SOLN: 1.0000 mg | INTRAVENOUS | Status: DC | PRN

## 2022-02-06 MED ORDER — LIDOCAINE 2% (20 MG/ML) 5 ML SYRINGE
INTRAMUSCULAR | Status: DC | PRN
  Administered 2022-02-06: 40 mg via INTRAVENOUS

## 2022-02-06 MED ORDER — CHLORHEXIDINE GLUCONATE 0.12 % MT SOLN: 15.0000 mL | Freq: Once | OROMUCOSAL | Status: AC

## 2022-02-06 MED ORDER — EPHEDRINE SULFATE-NACL 50-0.9 MG/10ML-% IV SOSY
PREFILLED_SYRINGE | INTRAVENOUS | Status: DC | PRN
  Administered 2022-02-06: 5 mg via INTRAVENOUS

## 2022-02-06 SURGICAL SUPPLY — 45 items
BAG COUNTER SPONGE SURGICOUNT (BAG) ×1 IMPLANT
BINDER BREAST XLRG (GAUZE/BANDAGES/DRESSINGS) IMPLANT
BNDG COHESIVE 4X5 TAN STRL LF (GAUZE/BANDAGES/DRESSINGS) IMPLANT
CANISTER SUCT 3000ML PPV (MISCELLANEOUS) ×2 IMPLANT
CHLORAPREP W/TINT 26 (MISCELLANEOUS) ×1 IMPLANT
CLIP TI MEDIUM 6 (CLIP) IMPLANT
COVER SURGICAL LIGHT HANDLE (MISCELLANEOUS) ×1 IMPLANT
DERMABOND ADVANCED .7 DNX12 (GAUZE/BANDAGES/DRESSINGS) ×1 IMPLANT
DERMABOND ADVANCED .7 DNX6 (GAUZE/BANDAGES/DRESSINGS) IMPLANT
DRAIN CHANNEL 19F RND (DRAIN) ×1 IMPLANT
DRAPE CHEST BREAST 15X10 FENES (DRAPES) ×1 IMPLANT
DRAPE HALF SHEET 40X57 (DRAPES) IMPLANT
ELECT BLADE 4.0 EZ CLEAN MEGAD (MISCELLANEOUS) ×1
ELECT REM PT RETURN 9FT ADLT (ELECTROSURGICAL) ×1
ELECTRODE BLDE 4.0 EZ CLN MEGD (MISCELLANEOUS) IMPLANT
ELECTRODE REM PT RTRN 9FT ADLT (ELECTROSURGICAL) ×1 IMPLANT
EVACUATOR SILICONE 100CC (DRAIN) ×1 IMPLANT
GAUZE SPONGE 4X4 12PLY STRL (GAUZE/BANDAGES/DRESSINGS) IMPLANT
GLOVE BIO SURGEON STRL SZ 6 (GLOVE) ×1 IMPLANT
GLOVE INDICATOR 6.5 STRL GRN (GLOVE) ×1 IMPLANT
GOWN STRL REUS W/ TWL LRG LVL3 (GOWN DISPOSABLE) ×1 IMPLANT
GOWN STRL REUS W/TWL 2XL LVL3 (GOWN DISPOSABLE) ×1 IMPLANT
GOWN STRL REUS W/TWL LRG LVL3 (GOWN DISPOSABLE) ×1
KIT BASIN OR (CUSTOM PROCEDURE TRAY) ×1 IMPLANT
KIT TURNOVER KIT B (KITS) ×1 IMPLANT
LIGHT WAVEGUIDE WIDE FLAT (MISCELLANEOUS) IMPLANT
NDL HYPO 25GX1X1/2 BEV (NEEDLE) IMPLANT
NEEDLE HYPO 25GX1X1/2 BEV (NEEDLE) ×1 IMPLANT
NS IRRIG 1000ML POUR BTL (IV SOLUTION) ×1 IMPLANT
PACK GENERAL/GYN (CUSTOM PROCEDURE TRAY) ×1 IMPLANT
PACK UNIVERSAL I (CUSTOM PROCEDURE TRAY) IMPLANT
PAD ARMBOARD 7.5X6 YLW CONV (MISCELLANEOUS) ×1 IMPLANT
SPONGE T-LAP 18X18 ~~LOC~~+RFID (SPONGE) IMPLANT
STOCKINETTE 6  STRL (DRAPES) ×1
STOCKINETTE 6 STRL (DRAPES) IMPLANT
STRIP CLOSURE SKIN 1/2X4 (GAUZE/BANDAGES/DRESSINGS) ×1 IMPLANT
STRIP CLOSURE SKIN 1/4X4 (GAUZE/BANDAGES/DRESSINGS) IMPLANT
SUT ETHILON 2 0 FS 18 (SUTURE) ×1 IMPLANT
SUT MON AB 4-0 PC3 18 (SUTURE) ×1 IMPLANT
SUT SILK 2 0 SH (SUTURE) IMPLANT
SUT VIC AB 3-0 SH 8-18 (SUTURE) ×1 IMPLANT
SYR 3ML LL SCALE MARK (SYRINGE) IMPLANT
TOWEL GREEN STERILE (TOWEL DISPOSABLE) ×1 IMPLANT
TOWEL GREEN STERILE FF (TOWEL DISPOSABLE) ×1 IMPLANT
TRACER MAGTRACE VIAL (MISCELLANEOUS) IMPLANT

## 2022-02-06 NOTE — Anesthesia Procedure Notes (Signed)
Procedure Name: Intubation Date/Time: 02/06/2022 4:07 PM  Performed by: Reece Agar, CRNAPre-anesthesia Checklist: Patient identified, Emergency Drugs available, Suction available and Patient being monitored Patient Re-evaluated:Patient Re-evaluated prior to induction Oxygen Delivery Method: Circle System Utilized Preoxygenation: Pre-oxygenation with 100% oxygen Induction Type: IV induction Ventilation: Mask ventilation without difficulty Laryngoscope Size: Mac and 3 Grade View: Grade I Tube type: Oral Tube size: 7.0 mm Number of attempts: 1 Airway Equipment and Method: Stylet Placement Confirmation: ETT inserted through vocal cords under direct vision, positive ETCO2 and breath sounds checked- equal and bilateral Secured at: 21 cm Tube secured with: Tape Dental Injury: Teeth and Oropharynx as per pre-operative assessment

## 2022-02-06 NOTE — Anesthesia Procedure Notes (Signed)
Anesthesia Regional Block: Pectoralis block   Pre-Anesthetic Checklist: , timeout performed,  Correct Patient, Correct Site, Correct Laterality,  Correct Procedure, Correct Position, site marked,  Risks and benefits discussed,  Surgical consent,  Pre-op evaluation,  At surgeon's request and post-op pain management  Laterality: Left  Prep: Maximum Sterile Barrier Precautions used, chloraprep       Needles:  Injection technique: Single-shot  Needle Type: Echogenic Stimulator Needle     Needle Length: 9cm  Needle Gauge: 22     Additional Needles:   Procedures:,,,, ultrasound used (permanent image in chart),,    Narrative:  Start time: 02/06/2022 2:00 PM End time: 02/06/2022 2:05 PM Injection made incrementally with aspirations every 5 mL.  Performed by: Personally  Anesthesiologist: Pervis Hocking, DO  Additional Notes: Monitors applied. No increased pain on injection. No increased resistance to injection. Injection made in 5cc increments. Good needle visualization. Patient tolerated procedure well.

## 2022-02-06 NOTE — Anesthesia Postprocedure Evaluation (Signed)
Anesthesia Post Note  Patient: Donna Graham  Procedure(s) Performed: LEFT TOTAL MASTECTOMY (Left: Breast)     Patient location during evaluation: PACU Anesthesia Type: Regional and General Level of consciousness: awake Pain management: pain level controlled Vital Signs Assessment: post-procedure vital signs reviewed and stable Respiratory status: spontaneous breathing, nonlabored ventilation, respiratory function stable and patient connected to nasal cannula oxygen Cardiovascular status: blood pressure returned to baseline and stable Postop Assessment: no apparent nausea or vomiting Anesthetic complications: no   No notable events documented.  Last Vitals:  Vitals:   02/06/22 2100 02/06/22 2115  BP: (!) 166/90 (!) 154/99  Pulse:    Resp:  15  Temp:    SpO2:  99%    Last Pain:  Vitals:   02/06/22 2015  TempSrc:   PainSc: 0-No pain                 Serita Degroote P Jathen Sudano

## 2022-02-06 NOTE — Transfer of Care (Signed)
Immediate Anesthesia Transfer of Care Note  Patient: Donna Graham  Procedure(s) Performed: LEFT TOTAL MASTECTOMY (Left: Breast)  Patient Location: PACU  Anesthesia Type:GA combined with regional for post-op pain  Level of Consciousness: awake and alert   Airway & Oxygen Therapy: Patient Spontanous Breathing and Patient connected to face mask oxygen  Post-op Assessment: Report given to RN and Post -op Vital signs reviewed and stable  Post vital signs: Reviewed and stable  Last Vitals:  Vitals Value Taken Time  BP 165/98 02/06/22 1833  Temp 36.4 C 02/06/22 1833  Pulse 75 02/06/22 1836  Resp 17 02/06/22 1836  SpO2 98 % 02/06/22 1836  Vitals shown include unvalidated device data.  Last Pain:  Vitals:   02/06/22 1410  TempSrc:   PainSc: 0-No pain         Complications: No notable events documented.

## 2022-02-06 NOTE — Op Note (Addendum)
Left total Mastectomy with Magtrace injection  Indications: This patient presents with history of Left breast cancer, upper inner quadrant, cTis, 2 cm intermediate grade DCIS  Pre-operative Diagnosis: DCIS  Post-operative Diagnosis: same  Surgeon: Stark Klein   Assist: Toula Moos, MD  Anesthesia: General endotracheal anesthesia and pectoral block  Procedure Details  The patient was seen in the Holding Room. The risks, benefits, complications, treatment options, and expected outcomes were discussed with the patient. The possibilities of reaction to medication, pulmonary aspiration, bleeding, infection, the need for additional procedures, failure to diagnose a condition, and creating a complication requiring transfusion or operation were discussed with the patient. The patient concurred with the proposed plan, giving informed consent.  The site of surgery properly noted/marked.  After induction of anesthesia, the left arm, breast, and chest were prepped and draped in standard fashion. The borders of the breast were identified and marked.  The incision was drawn out to make sure incision lines were equidistant in length. MagTrace was injected periareolarly and massaged.   The superior incision was made with the #10 blade.  Mastectomy hooks were used to provide elevation of the skin edges. The skin and subcutaneous fat were dissected free of the underlying breast tissue using electrocautery. The dissection was taken to the fascia of the pectoralis major. The superior flap was taken medially to the lateral sternal border, superiorly to the inferior border of the clavicle.  The inferior flap was similarly created, inferiorly to the inframammary fold and laterally to the border of the latissimus.  The breast was taken off including the pectoralis fascia and the axillary tail marked with a stitch.       The wound was irrigated. One 19 Blake drain was placed laterally and secured with a 2-0 nylon.  Hemostasis was achieved with cautery.  The wound was irrigated and closed with 3-0 Vicryl deep dermal interrupted sutures and 4-0 Vicryl subcuticular closure in layers.    Sterile dressings were applied. At the end of the operation, all sponge, instrument, and needle counts were correct.  Findings: grossly clear surgical margins, no adenopathy  Estimated Blood Loss: 100 ml          Drains: 19 Fr blake drain in L chest wall                Specimens: Left breast and          Complications:  None; patient tolerated the procedure well.         Disposition: PACU - hemodynamically stable.         Condition: stable

## 2022-02-06 NOTE — Discharge Instructions (Signed)
[facility] [phone]  MASTECTOMY: POST OP INSTRUCTIONS  Always review your discharge instruction sheet given to you by the facility where your surgery was performed. IF YOU HAVE DISABILITY OR FAMILY LEAVE FORMS, YOU MUST BRING THEM TO THE OFFICE FOR PROCESSING.   DO NOT GIVE THEM TO YOUR DOCTOR. A prescription for pain medication may be given to you upon discharge.  Take your pain medication as prescribed, if needed.  If narcotic pain medicine is not needed, then you may take acetaminophen (Tylenol) or ibuprofen (Advil) as needed. Take your usually prescribed medications unless otherwise directed. If you need a refill on your pain medication, please contact your pharmacy.  They will contact our office to request authorization.  Prescriptions will not be filled after 5pm or on week-ends. You should follow a light diet the first few days after arrival home, such as soup and crackers, etc.  Resume your normal diet the day after surgery. Most patients will experience some swelling and bruising on the chest and underarm.  Ice packs will help.  Swelling and bruising can take several days to resolve.  It is common to experience some constipation if taking pain medication after surgery.  Increasing fluid intake and taking a stool softener (such as Colace) will usually help or prevent this problem from occurring.  A mild laxative (Milk of Magnesia or Miralax) should be taken according to package instructions if there are no bowel movements after 48 hours. Unless discharge instructions indicate otherwise, leave your bandage dry and in place until your next appointment in 3-5 days.  You may take a limited sponge bath.  No tube baths or showers until the drains are removed.  You may have steri-strips (small skin tapes) in place directly over the incision.  These strips should be left on the skin for 7-10 days.  If your surgeon used skin glue on the incision, you may shower in 24 hours.   The glue will flake off over the next 2-3 weeks.  Any sutures or staples will be removed at the office during your follow-up visit. DRAINS:  If you have drains in place, it is important to keep a list of the amount of drainage produced each day in your drains.  Before leaving the hospital, you should be instructed on drain care.  Call our office if you have any questions about your drains. ACTIVITIES:  You may resume regular (light) daily activities beginning the next day--such as daily self-care, walking, climbing stairs--gradually increasing activities as tolerated.  You may have sexual intercourse when it is comfortable.  Refrain from any heavy lifting or straining until approved by your doctor. You may drive when you are no longer taking prescription pain medication, you can comfortably wear a seatbelt, and you can safely maneuver your car and apply brakes. RETURN TO WORK:  __________________________________________________________ You should see your doctor in the office for a follow-up appointment approximately 3-5 days after your surgery.  Your doctor's nurse will typically make your follow-up appointment when she calls you with your pathology report.  Expect your pathology report 2-3 business days after your surgery.  You may call to check if you do not hear from us after three days.   OTHER INSTRUCTIONS: ______________________________________________________________________________________________ ____________________________________________________________________________________________ WHEN TO CALL YOUR DOCTOR: Fever over 101.0 Nausea and/or vomiting Extreme swelling or bruising Continued bleeding from incision. Increased pain, redness, or drainage from the incision. The clinic staff is available to answer your questions during regular business hours.  Please don't hesitate   to call and ask to speak to one of the nurses for clinical concerns.  If you have a medical emergency, go to the  nearest emergency room or call 911.  A surgeon from [facility] is always on call at the hospital. [address], [address], [place], [place]  [address] ? [address] [address], [place], [place]   [address] [phone] ? [phone] ? FAX [phone] Web site: [email or web address]  

## 2022-02-06 NOTE — Anesthesia Preprocedure Evaluation (Addendum)
Anesthesia Evaluation  Patient identified by MRN, date of birth, ID band Patient awake    Reviewed: Allergy & Precautions, NPO status , Patient's Chart, lab work & pertinent test results  Airway Mallampati: III  TM Distance: >3 FB Neck ROM: Full    Dental  (+) Dental Advisory Given, Poor Dentition, Missing   Pulmonary neg pulmonary ROS,    Pulmonary exam normal breath sounds clear to auscultation       Cardiovascular hypertension (140s-180s SBPs in preop, no home meds), Normal cardiovascular exam Rhythm:Regular Rate:Normal     Neuro/Psych negative neurological ROS  negative psych ROS   GI/Hepatic negative GI ROS, Neg liver ROS,   Endo/Other  negative endocrine ROS  Renal/GU negative Renal ROS  negative genitourinary   Musculoskeletal  (+) Arthritis , Osteoarthritis,    Abdominal   Peds  Hematology negative hematology ROS (+)   Anesthesia Other Findings   Reproductive/Obstetrics negative OB ROS                            Anesthesia Physical Anesthesia Plan  ASA: 2  Anesthesia Plan: General and Regional   Post-op Pain Management: Regional block* and Tylenol PO (pre-op)*   Induction: Intravenous  PONV Risk Score and Plan: 3 and Ondansetron, Dexamethasone and Treatment may vary due to age or medical condition  Airway Management Planned: Oral ETT  Additional Equipment: None  Intra-op Plan:   Post-operative Plan: Extubation in OR  Informed Consent: I have reviewed the patients History and Physical, chart, labs and discussed the procedure including the risks, benefits and alternatives for the proposed anesthesia with the patient or authorized representative who has indicated his/her understanding and acceptance.     Dental advisory given  Plan Discussed with: CRNA  Anesthesia Plan Comments:        Anesthesia Quick Evaluation

## 2022-02-06 NOTE — Interval H&P Note (Signed)
History and Physical Interval Note:  02/06/2022 1:11 PM  Donna Graham  has presented today for surgery, with the diagnosis of LEFT BREAST CANCER.  The various methods of treatment have been discussed with the patient and family. After consideration of risks, benefits and other options for treatment, the patient has consented to  Procedure(s): LEFT TOTAL MASTECTOMY (Left) as a surgical intervention.  The patient's history has been reviewed, patient examined, no change in status, stable for surgery.  I have reviewed the patient's chart and labs.  Questions were answered to the patient's satisfaction.     Stark Klein

## 2022-02-07 ENCOUNTER — Encounter (HOSPITAL_COMMUNITY): Payer: Self-pay | Admitting: General Surgery

## 2022-02-07 DIAGNOSIS — Z853 Personal history of malignant neoplasm of breast: Secondary | ICD-10-CM | POA: Diagnosis not present

## 2022-02-07 DIAGNOSIS — Z9011 Acquired absence of right breast and nipple: Secondary | ICD-10-CM | POA: Diagnosis not present

## 2022-02-07 DIAGNOSIS — I10 Essential (primary) hypertension: Secondary | ICD-10-CM | POA: Diagnosis not present

## 2022-02-07 DIAGNOSIS — Z170 Estrogen receptor positive status [ER+]: Secondary | ICD-10-CM | POA: Diagnosis not present

## 2022-02-07 DIAGNOSIS — Z803 Family history of malignant neoplasm of breast: Secondary | ICD-10-CM | POA: Diagnosis not present

## 2022-02-07 DIAGNOSIS — D0512 Intraductal carcinoma in situ of left breast: Secondary | ICD-10-CM | POA: Diagnosis not present

## 2022-02-07 DIAGNOSIS — Z923 Personal history of irradiation: Secondary | ICD-10-CM | POA: Diagnosis not present

## 2022-02-07 LAB — CBC
HCT: 35 % — ABNORMAL LOW (ref 36.0–46.0)
Hemoglobin: 11.5 g/dL — ABNORMAL LOW (ref 12.0–15.0)
MCH: 29 pg (ref 26.0–34.0)
MCHC: 32.9 g/dL (ref 30.0–36.0)
MCV: 88.2 fL (ref 80.0–100.0)
Platelets: 308 10*3/uL (ref 150–400)
RBC: 3.97 MIL/uL (ref 3.87–5.11)
RDW: 13.9 % (ref 11.5–15.5)
WBC: 11.8 10*3/uL — ABNORMAL HIGH (ref 4.0–10.5)
nRBC: 0 % (ref 0.0–0.2)

## 2022-02-07 LAB — BASIC METABOLIC PANEL WITH GFR
Anion gap: 7 (ref 5–15)
BUN: 8 mg/dL (ref 8–23)
CO2: 26 mmol/L (ref 22–32)
Calcium: 10.3 mg/dL (ref 8.9–10.3)
Chloride: 107 mmol/L (ref 98–111)
Creatinine, Ser: 0.96 mg/dL (ref 0.44–1.00)
GFR, Estimated: >60 mL/min (ref >60)
Glucose, Bld: 191 mg/dL — ABNORMAL HIGH (ref 70–99)
Potassium: 3.6 mmol/L (ref 3.5–5.1)
Sodium: 140 mmol/L (ref 135–145)

## 2022-02-07 MED ORDER — TRAMADOL HCL 50 MG PO TABS: 50.0000 mg | ORAL_TABLET | Freq: Four times a day (QID) | ORAL | 1 refills | Status: AC | PRN

## 2022-02-07 NOTE — Progress Notes (Signed)
Mobility Specialist - Progress Note   02/07/22 0858  Mobility  Activity Ambulated independently in hallway  Level of Assistance Independent  Assistive Device None  Distance Ambulated (ft) 550 ft  Activity Response Tolerated well  $Mobility charge 1 Mobility    Pt received in bed agreeable to mobility. No physical assistance needed, no complaints. Left in room w/ call bell in reach and all needs met.   Paulla Dolly Mobility Specialist

## 2022-02-08 LAB — SURGICAL PATHOLOGY

## 2022-02-20 ENCOUNTER — Telehealth: Payer: Self-pay | Admitting: Hematology and Oncology

## 2022-02-20 NOTE — Telephone Encounter (Signed)
R/s pt's new pt appt per pt request. Pt is aware of new appt date/time.  

## 2022-02-26 ENCOUNTER — Inpatient Hospital Stay: Payer: Medicare HMO | Admitting: Hematology and Oncology

## 2022-03-05 ENCOUNTER — Inpatient Hospital Stay: Payer: Medicare HMO | Attending: Hematology and Oncology | Admitting: Hematology and Oncology

## 2022-03-05 ENCOUNTER — Encounter: Payer: Self-pay | Admitting: Hematology and Oncology

## 2022-03-05 ENCOUNTER — Inpatient Hospital Stay: Payer: Medicare HMO

## 2022-03-05 ENCOUNTER — Other Ambulatory Visit: Payer: Self-pay

## 2022-03-05 DIAGNOSIS — D0512 Intraductal carcinoma in situ of left breast: Secondary | ICD-10-CM | POA: Insufficient documentation

## 2022-03-05 DIAGNOSIS — Z9013 Acquired absence of bilateral breasts and nipples: Secondary | ICD-10-CM | POA: Insufficient documentation

## 2022-03-05 DIAGNOSIS — C50212 Malignant neoplasm of upper-inner quadrant of left female breast: Secondary | ICD-10-CM

## 2022-03-05 DIAGNOSIS — Z170 Estrogen receptor positive status [ER+]: Secondary | ICD-10-CM | POA: Insufficient documentation

## 2022-03-05 DIAGNOSIS — Z923 Personal history of irradiation: Secondary | ICD-10-CM | POA: Diagnosis not present

## 2022-03-05 NOTE — Assessment & Plan Note (Signed)
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 Patient reports history of right breast mastectomy and implant placement however she does admit to having adjuvant radiation.  It is not quite clear if she had a right breast partial mastectomy versus total mastectomy but she tells me that she has never had a mammogram on the side for the past 35+ years.  She now had left breast mastectomy.  If she indeed had bilateral mastectomy, then there is no role for antiestrogen therapy.  If she did not have bilateral mastectomy, we have discussed about considering tamoxifen versus aromatase inhibitors.   We have discussed options for antiestrogen therapy today  With regards to Tamoxifen, we discussed that this is a SERM, selective estrogen receptor modulator. We discussed mechanism of action of Tamoxifen, adverse effects on Tamoxifen including but not limited to post menopausal symptoms, increased risk of DVT/PE, increased risk of endometrial cancer, questionable cataracts with long term use and increased risk of cardiovascular events in the study which was not statistically significant. A benefit from Tamoxifen would be improvement in bone density. With regards to aromatase inhibitors, we discussed mechanism of action, adverse effects including but not limited to post menopausal symptoms, arthralgias, myalgias, increased risk of cardiovascular events and bone loss.  Tamoxifen can be used in premenopausal and post menopausal women. Aromatase inhibitors can only be used in premenopausal women along with OFS.    She at this time is not interested in pursuing any form of antiestrogen therapy.  She tells me that her mom had breast cancer and went through tamoxifen but eventually had metastatic breast cancer.  She understands the benefits of antiestrogen therapy in case she did not bilateral  mastectomy but she seems pretty certain that she did have bilateral mastectomy.  Given her history of bilateral breast cancers, we have discussed about genetic testing and she declined it.  I have sent an in basket message to Dr. Barry Dienes if she is aware of any additional information.  At this time she will proceed with surveillance.  She will alternate between her primary care physician as well as [place].  She will return to clinic in 1 year or sooner as needed.

## 2022-03-05 NOTE — Progress Notes (Signed)
[facility] CONSULT NOTE  Patient Care Team: Caryl Bis, MD as PCP - General (Family Medicine)  CHIEF COMPLAINTS/PURPOSE OF CONSULTATION:  Newly diagnosed breast cancer  HISTORY OF PRESENTING ILLNESS:  Donna Graham 76 y.o. female is here because of recent diagnosis of left breast DCIS  I reviewed her records extensively and collaborated the history with the patient.  SUMMARY OF ONCOLOGIC HISTORY: Oncology History  Primary cancer of upper inner quadrant of left breast ([place])  01/18/2022 Mammogram   Patient had diagnostic left mammogram which showed a group of calcifications in the upper inner breast.  She had stereotactic guided biopsy which showed intermediate grade DCIS, ER/PR 100% positive strong staining   02/06/2022 Initial Diagnosis   Primary cancer of upper inner quadrant of left breast ([place])   02/06/2022 Definitive Surgery   Left breast mastectomy showed DCIS, intermediate nuclear grade, solid and cribriform types, negative for invasive carcinoma.  DCIS measures approximately 0.8 cm and focally involves a complex sclerosing lesion.  Margins free of carcinoma.  3 benign lymph nodes negative for carcinoma.   03/05/2022 Cancer Staging   Staging form: Breast, AJCC 8th Edition - Clinical: Stage 0 (cTis (DCIS), cN0, cM0, G2, ER+, PR+) - Signed by Benay Pike, MD on 03/05/2022 Stage prefix: Initial diagnosis Histologic grading system: 3 grade system    Patient arrived to the appointment today with her sister.  Back in 1987, she reports right breast mastectomy, adjuvant radiation although the history in the chart she notes that she had right breast lumpectomy.  She tells me that she has never had a mammogram on that side since the surgery.  She now has left breast mastectomy and has healed well.  She is very reluctant to consider any form of antiestrogen therapy.  Rest of the pertinent 10 point ROS reviewed and negative  MEDICAL HISTORY:  Past Medical  History:  Diagnosis Date   Breast cancer ([place]) 1987   [facility]    SURGICAL HISTORY: Past Surgical History:  Procedure Laterality Date   BREAST SURGERY Right 1987   lumpectomy to right breast   TOTAL MASTECTOMY Left 02/06/2022   Procedure: LEFT TOTAL MASTECTOMY;  Surgeon: Stark Klein, MD;  Location: [facility] OR;  Service: General;  Laterality: Left;    SOCIAL HISTORY: Social History   Socioeconomic History   Marital status: Married    Spouse name: Jaci Standard   Number of children: 3   Years of education: 12   Highest education level: Not on file  Occupational History   Occupation: retired  Tobacco Use   Smoking status: Never   Smokeless tobacco: Never  Vaping Use   Vaping Use: Never used  Substance and Sexual Activity   Alcohol use: No   Drug use: No   Sexual activity: Not Currently  Other Topics Concern   Not on file  Social History Narrative   Not on file   Social Determinants of Health   Financial Resource Strain: Not on file  Food Insecurity: Not on file  Transportation Needs: Not on file  Physical Activity: Not on file  Stress: Not on file  Social Connections: Not on file  Intimate Partner Violence: Not on file    FAMILY HISTORY: History reviewed. No pertinent family history.  ALLERGIES:  has No Known Allergies.  MEDICATIONS:  Current Outpatient Medications  Medication Sig Dispense Refill   acetaminophen (TYLENOL) 500 MG tablet Take 500 mg by mouth every 6 (six) hours as needed for moderate pain.  ciprofloxacin-dexamethasone (CIPRODEX) OTIC suspension Place 2 drops into the right ear as needed (ear wax).     Multiple Vitamin (MULTIVITAMIN WITH MINERALS) TABS tablet Take 1 tablet by mouth daily.     traMADol (ULTRAM) 50 MG tablet Take 1 tablet (50 mg total) by mouth every 6 (six) hours as needed (mild pain). 15 tablet 1   No current facility-administered medications for this visit.    REVIEW OF SYSTEMS:   Constitutional: Denies fevers, chills  or abnormal night sweats Eyes: Denies blurriness of vision, double vision or watery eyes Ears, nose, mouth, throat, and face: Denies mucositis or sore throat Respiratory: Denies cough, dyspnea or wheezes Cardiovascular: Denies palpitation, chest discomfort or lower extremity swelling Gastrointestinal:  Denies nausea, heartburn or change in bowel habits Skin: Denies abnormal skin rashes Lymphatics: Denies new lymphadenopathy or easy bruising Neurological:Denies numbness, tingling or new weaknesses Behavioral/Psych: Mood is stable, no new changes  Breast: Denies any palpable lumps or discharge All other systems were reviewed with the patient and are negative.  PHYSICAL EXAMINATION: ECOG PERFORMANCE STATUS: 0 - Asymptomatic  Vitals:   03/05/22 1037  BP: (!) 176/96  Pulse: 73  Resp: 18  Temp: 97.9 F (36.6 C)  SpO2: 100%   Filed Weights   03/05/22 1037  Weight: 147 lb 1.6 oz (66.7 kg)    General appearance: Alert, oriented and in no acute distress BREAST: She is status post left mastectomy, scar has healed well.  Right breast with an implant but I am unsure if this is a total mastectomy or a partial mastectomy.  LABORATORY DATA:  I have reviewed the data as listed Lab Results  Component Value Date   WBC 11.8 (H) 02/07/2022   HGB 11.5 (L) 02/07/2022   HCT 35.0 (L) 02/07/2022   MCV 88.2 02/07/2022   PLT 308 02/07/2022   Lab Results  Component Value Date   NA 140 02/07/2022   K 3.6 02/07/2022   CL 107 02/07/2022   CO2 26 02/07/2022    RADIOGRAPHIC STUDIES: I have personally reviewed the radiological reports and agreed with the findings in the report.  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 Patient reports history of right breast mastectomy and  implant placement however she does admit to having adjuvant radiation.  It is not quite clear if she had a right breast partial mastectomy versus total mastectomy but she tells me that she has never had a mammogram on the side for the past 35+ years.  She now had left breast mastectomy.  If she indeed had bilateral mastectomy, then there is no role for antiestrogen therapy.  If she did not have bilateral mastectomy, we have discussed about considering tamoxifen versus aromatase inhibitors.   We have discussed options for antiestrogen therapy today  With regards to Tamoxifen, we discussed that this is a SERM, selective estrogen receptor modulator. We discussed mechanism of action of Tamoxifen, adverse effects on Tamoxifen including but not limited to post menopausal symptoms, increased risk of DVT/PE, increased risk of endometrial cancer, questionable cataracts with long term use and increased risk of cardiovascular events in the study which was not statistically significant. A benefit from Tamoxifen would be improvement in bone density. With regards to aromatase inhibitors, we discussed mechanism of action, adverse effects including but not limited to post menopausal  symptoms, arthralgias, myalgias, increased risk of cardiovascular events and bone loss.  Tamoxifen can be used in premenopausal and post menopausal women. Aromatase inhibitors can only be used in premenopausal women along with OFS.    She at this time is not interested in pursuing any form of antiestrogen therapy.  She tells me that her mom had breast cancer and went through tamoxifen but eventually had metastatic breast cancer.  She understands the benefits of antiestrogen therapy in case she did not bilateral mastectomy but she seems pretty certain that she did have bilateral mastectomy.  Given her history of bilateral breast cancers, we have discussed about genetic testing and she declined it.  I have sent an in basket message to Dr.  Barry Dienes if she is aware of any additional information.  At this time she will proceed with surveillance.  She will alternate between her primary care physician as well as [place].  She will return to clinic in 1 year or sooner as needed.  Total time spent: 45 minutes including history, physical exam, review of records, counseling and coordination of care All questions were answered. The patient knows to call the clinic with any problems, questions or concerns.    Benay Pike, MD 03/05/22

## 2022-03-06 ENCOUNTER — Encounter: Payer: Self-pay | Admitting: *Deleted

## 2022-03-06 DIAGNOSIS — I10 Essential (primary) hypertension: Secondary | ICD-10-CM | POA: Diagnosis not present

## 2022-03-06 DIAGNOSIS — C50212 Malignant neoplasm of upper-inner quadrant of left female breast: Secondary | ICD-10-CM

## 2022-03-07 DIAGNOSIS — I890 Lymphedema, not elsewhere classified: Secondary | ICD-10-CM | POA: Diagnosis not present

## 2022-03-08 DIAGNOSIS — E875 Hyperkalemia: Secondary | ICD-10-CM | POA: Diagnosis not present

## 2022-03-12 DIAGNOSIS — I890 Lymphedema, not elsewhere classified: Secondary | ICD-10-CM | POA: Diagnosis not present

## 2022-03-14 DIAGNOSIS — I10 Essential (primary) hypertension: Secondary | ICD-10-CM | POA: Diagnosis not present

## 2022-03-15 DIAGNOSIS — I890 Lymphedema, not elsewhere classified: Secondary | ICD-10-CM | POA: Diagnosis not present

## 2022-03-19 DIAGNOSIS — I890 Lymphedema, not elsewhere classified: Secondary | ICD-10-CM | POA: Diagnosis not present

## 2022-03-21 DIAGNOSIS — I890 Lymphedema, not elsewhere classified: Secondary | ICD-10-CM | POA: Diagnosis not present

## 2022-03-26 DIAGNOSIS — I890 Lymphedema, not elsewhere classified: Secondary | ICD-10-CM | POA: Diagnosis not present

## 2022-03-28 DIAGNOSIS — I890 Lymphedema, not elsewhere classified: Secondary | ICD-10-CM | POA: Diagnosis not present

## 2022-04-02 DIAGNOSIS — I890 Lymphedema, not elsewhere classified: Secondary | ICD-10-CM | POA: Diagnosis not present

## 2022-04-04 DIAGNOSIS — I890 Lymphedema, not elsewhere classified: Secondary | ICD-10-CM | POA: Diagnosis not present

## 2022-04-06 DIAGNOSIS — Z9013 Acquired absence of bilateral breasts and nipples: Secondary | ICD-10-CM | POA: Diagnosis not present

## 2022-04-06 DIAGNOSIS — Z4431 Encounter for fitting and adjustment of external right breast prosthesis: Secondary | ICD-10-CM | POA: Diagnosis not present

## 2022-04-06 DIAGNOSIS — Z4432 Encounter for fitting and adjustment of external left breast prosthesis: Secondary | ICD-10-CM | POA: Diagnosis not present

## 2022-04-06 DIAGNOSIS — C50919 Malignant neoplasm of unspecified site of unspecified female breast: Secondary | ICD-10-CM | POA: Diagnosis not present

## 2022-04-17 DIAGNOSIS — Z23 Encounter for immunization: Secondary | ICD-10-CM | POA: Diagnosis not present

## 2022-04-17 DIAGNOSIS — Z6827 Body mass index (BMI) 27.0-27.9, adult: Secondary | ICD-10-CM | POA: Diagnosis not present

## 2022-04-17 DIAGNOSIS — I10 Essential (primary) hypertension: Secondary | ICD-10-CM | POA: Diagnosis not present

## 2022-04-17 DIAGNOSIS — Z0001 Encounter for general adult medical examination with abnormal findings: Secondary | ICD-10-CM | POA: Diagnosis not present

## 2022-04-17 DIAGNOSIS — E7849 Other hyperlipidemia: Secondary | ICD-10-CM | POA: Diagnosis not present

## 2022-04-18 DIAGNOSIS — I890 Lymphedema, not elsewhere classified: Secondary | ICD-10-CM | POA: Diagnosis not present

## 2022-04-23 DIAGNOSIS — I890 Lymphedema, not elsewhere classified: Secondary | ICD-10-CM | POA: Diagnosis not present

## 2022-04-25 DIAGNOSIS — I890 Lymphedema, not elsewhere classified: Secondary | ICD-10-CM | POA: Diagnosis not present

## 2022-04-30 DIAGNOSIS — I890 Lymphedema, not elsewhere classified: Secondary | ICD-10-CM | POA: Diagnosis not present

## 2022-05-01 DIAGNOSIS — I972 Postmastectomy lymphedema syndrome: Secondary | ICD-10-CM | POA: Diagnosis not present

## 2022-05-01 DIAGNOSIS — I10 Essential (primary) hypertension: Secondary | ICD-10-CM | POA: Diagnosis not present

## 2022-06-01 DIAGNOSIS — I972 Postmastectomy lymphedema syndrome: Secondary | ICD-10-CM | POA: Diagnosis not present

## 2022-07-02 DIAGNOSIS — I972 Postmastectomy lymphedema syndrome: Secondary | ICD-10-CM | POA: Diagnosis not present

## 2022-07-30 DIAGNOSIS — H903 Sensorineural hearing loss, bilateral: Secondary | ICD-10-CM | POA: Diagnosis not present

## 2022-07-31 DIAGNOSIS — M1711 Unilateral primary osteoarthritis, right knee: Secondary | ICD-10-CM | POA: Diagnosis not present

## 2022-07-31 DIAGNOSIS — R7301 Impaired fasting glucose: Secondary | ICD-10-CM | POA: Diagnosis not present

## 2022-07-31 DIAGNOSIS — I972 Postmastectomy lymphedema syndrome: Secondary | ICD-10-CM | POA: Diagnosis not present

## 2022-07-31 DIAGNOSIS — Z6828 Body mass index (BMI) 28.0-28.9, adult: Secondary | ICD-10-CM | POA: Diagnosis not present

## 2022-07-31 DIAGNOSIS — E7849 Other hyperlipidemia: Secondary | ICD-10-CM | POA: Diagnosis not present

## 2022-07-31 DIAGNOSIS — M1712 Unilateral primary osteoarthritis, left knee: Secondary | ICD-10-CM | POA: Diagnosis not present

## 2022-07-31 DIAGNOSIS — R030 Elevated blood-pressure reading, without diagnosis of hypertension: Secondary | ICD-10-CM | POA: Diagnosis not present

## 2022-08-17 DIAGNOSIS — H903 Sensorineural hearing loss, bilateral: Secondary | ICD-10-CM | POA: Diagnosis not present

## 2022-08-31 DIAGNOSIS — I972 Postmastectomy lymphedema syndrome: Secondary | ICD-10-CM | POA: Diagnosis not present

## 2022-09-05 DIAGNOSIS — R030 Elevated blood-pressure reading, without diagnosis of hypertension: Secondary | ICD-10-CM | POA: Diagnosis not present

## 2022-09-05 DIAGNOSIS — Z6827 Body mass index (BMI) 27.0-27.9, adult: Secondary | ICD-10-CM | POA: Diagnosis not present

## 2022-09-05 DIAGNOSIS — K121 Other forms of stomatitis: Secondary | ICD-10-CM | POA: Diagnosis not present

## 2022-09-11 DIAGNOSIS — K121 Other forms of stomatitis: Secondary | ICD-10-CM | POA: Diagnosis not present

## 2022-09-11 DIAGNOSIS — R030 Elevated blood-pressure reading, without diagnosis of hypertension: Secondary | ICD-10-CM | POA: Diagnosis not present

## 2022-09-11 DIAGNOSIS — Z6827 Body mass index (BMI) 27.0-27.9, adult: Secondary | ICD-10-CM | POA: Diagnosis not present

## 2022-09-30 DIAGNOSIS — I972 Postmastectomy lymphedema syndrome: Secondary | ICD-10-CM | POA: Diagnosis not present

## 2022-10-31 DIAGNOSIS — I972 Postmastectomy lymphedema syndrome: Secondary | ICD-10-CM | POA: Diagnosis not present

## 2022-11-30 DIAGNOSIS — I972 Postmastectomy lymphedema syndrome: Secondary | ICD-10-CM | POA: Diagnosis not present

## 2022-12-31 DIAGNOSIS — I972 Postmastectomy lymphedema syndrome: Secondary | ICD-10-CM | POA: Diagnosis not present

## 2023-01-31 DIAGNOSIS — I972 Postmastectomy lymphedema syndrome: Secondary | ICD-10-CM | POA: Diagnosis not present

## 2023-02-13 DIAGNOSIS — Z6826 Body mass index (BMI) 26.0-26.9, adult: Secondary | ICD-10-CM | POA: Diagnosis not present

## 2023-02-13 DIAGNOSIS — K121 Other forms of stomatitis: Secondary | ICD-10-CM | POA: Diagnosis not present

## 2023-02-13 DIAGNOSIS — R030 Elevated blood-pressure reading, without diagnosis of hypertension: Secondary | ICD-10-CM | POA: Diagnosis not present

## 2023-02-26 ENCOUNTER — Inpatient Hospital Stay: Payer: Medicare HMO | Attending: Adult Health | Admitting: Adult Health

## 2023-02-28 DIAGNOSIS — R030 Elevated blood-pressure reading, without diagnosis of hypertension: Secondary | ICD-10-CM | POA: Diagnosis not present

## 2023-02-28 DIAGNOSIS — N184 Chronic kidney disease, stage 4 (severe): Secondary | ICD-10-CM | POA: Diagnosis not present

## 2023-02-28 DIAGNOSIS — Z6826 Body mass index (BMI) 26.0-26.9, adult: Secondary | ICD-10-CM | POA: Diagnosis not present

## 2023-02-28 DIAGNOSIS — M25512 Pain in left shoulder: Secondary | ICD-10-CM | POA: Diagnosis not present

## 2023-03-06 DIAGNOSIS — M25512 Pain in left shoulder: Secondary | ICD-10-CM | POA: Diagnosis not present

## 2023-03-06 DIAGNOSIS — Z6826 Body mass index (BMI) 26.0-26.9, adult: Secondary | ICD-10-CM | POA: Diagnosis not present

## 2023-04-22 DIAGNOSIS — Z170 Estrogen receptor positive status [ER+]: Secondary | ICD-10-CM | POA: Diagnosis not present

## 2023-04-22 DIAGNOSIS — C50212 Malignant neoplasm of upper-inner quadrant of left female breast: Secondary | ICD-10-CM | POA: Diagnosis not present

## 2023-04-22 DIAGNOSIS — Z853 Personal history of malignant neoplasm of breast: Secondary | ICD-10-CM | POA: Diagnosis not present

## 2023-05-30 DIAGNOSIS — M19012 Primary osteoarthritis, left shoulder: Secondary | ICD-10-CM | POA: Diagnosis not present

## 2023-05-30 DIAGNOSIS — M79603 Pain in arm, unspecified: Secondary | ICD-10-CM | POA: Diagnosis not present

## 2023-05-30 DIAGNOSIS — M25561 Pain in right knee: Secondary | ICD-10-CM | POA: Diagnosis not present

## 2023-05-30 DIAGNOSIS — W1830XA Fall on same level, unspecified, initial encounter: Secondary | ICD-10-CM | POA: Diagnosis not present

## 2023-05-30 DIAGNOSIS — S50312A Abrasion of left elbow, initial encounter: Secondary | ICD-10-CM | POA: Diagnosis not present

## 2023-05-30 DIAGNOSIS — S40012A Contusion of left shoulder, initial encounter: Secondary | ICD-10-CM | POA: Diagnosis not present

## 2023-05-30 DIAGNOSIS — I10 Essential (primary) hypertension: Secondary | ICD-10-CM | POA: Diagnosis not present

## 2023-05-30 DIAGNOSIS — M25512 Pain in left shoulder: Secondary | ICD-10-CM | POA: Diagnosis not present

## 2023-05-30 DIAGNOSIS — W19XXXA Unspecified fall, initial encounter: Secondary | ICD-10-CM | POA: Diagnosis not present

## 2023-05-30 DIAGNOSIS — M1711 Unilateral primary osteoarthritis, right knee: Secondary | ICD-10-CM | POA: Diagnosis not present

## 2023-06-13 DIAGNOSIS — S20212A Contusion of left front wall of thorax, initial encounter: Secondary | ICD-10-CM | POA: Diagnosis not present

## 2023-06-13 DIAGNOSIS — S40012A Contusion of left shoulder, initial encounter: Secondary | ICD-10-CM | POA: Diagnosis not present

## 2023-06-13 DIAGNOSIS — S9002XA Contusion of left ankle, initial encounter: Secondary | ICD-10-CM | POA: Diagnosis not present

## 2023-06-13 DIAGNOSIS — R262 Difficulty in walking, not elsewhere classified: Secondary | ICD-10-CM | POA: Diagnosis not present

## 2023-06-14 DIAGNOSIS — S40012A Contusion of left shoulder, initial encounter: Secondary | ICD-10-CM | POA: Diagnosis not present

## 2023-06-14 DIAGNOSIS — S9002XA Contusion of left ankle, initial encounter: Secondary | ICD-10-CM | POA: Diagnosis not present

## 2023-06-14 DIAGNOSIS — R262 Difficulty in walking, not elsewhere classified: Secondary | ICD-10-CM | POA: Diagnosis not present

## 2023-06-14 DIAGNOSIS — M6281 Muscle weakness (generalized): Secondary | ICD-10-CM | POA: Diagnosis not present

## 2023-06-17 DIAGNOSIS — M6281 Muscle weakness (generalized): Secondary | ICD-10-CM | POA: Diagnosis not present

## 2023-06-17 DIAGNOSIS — S40012A Contusion of left shoulder, initial encounter: Secondary | ICD-10-CM | POA: Diagnosis not present

## 2023-06-17 DIAGNOSIS — R262 Difficulty in walking, not elsewhere classified: Secondary | ICD-10-CM | POA: Diagnosis not present

## 2023-06-17 DIAGNOSIS — S9002XA Contusion of left ankle, initial encounter: Secondary | ICD-10-CM | POA: Diagnosis not present

## 2023-06-19 DIAGNOSIS — M6281 Muscle weakness (generalized): Secondary | ICD-10-CM | POA: Diagnosis not present

## 2023-06-19 DIAGNOSIS — S40012A Contusion of left shoulder, initial encounter: Secondary | ICD-10-CM | POA: Diagnosis not present

## 2023-06-19 DIAGNOSIS — S9002XA Contusion of left ankle, initial encounter: Secondary | ICD-10-CM | POA: Diagnosis not present

## 2023-06-19 DIAGNOSIS — R262 Difficulty in walking, not elsewhere classified: Secondary | ICD-10-CM | POA: Diagnosis not present

## 2023-06-24 DIAGNOSIS — S9002XA Contusion of left ankle, initial encounter: Secondary | ICD-10-CM | POA: Diagnosis not present

## 2023-06-24 DIAGNOSIS — M6281 Muscle weakness (generalized): Secondary | ICD-10-CM | POA: Diagnosis not present

## 2023-06-24 DIAGNOSIS — S40012A Contusion of left shoulder, initial encounter: Secondary | ICD-10-CM | POA: Diagnosis not present

## 2023-06-24 DIAGNOSIS — R262 Difficulty in walking, not elsewhere classified: Secondary | ICD-10-CM | POA: Diagnosis not present

## 2023-06-26 DIAGNOSIS — R262 Difficulty in walking, not elsewhere classified: Secondary | ICD-10-CM | POA: Diagnosis not present

## 2023-06-26 DIAGNOSIS — S9002XA Contusion of left ankle, initial encounter: Secondary | ICD-10-CM | POA: Diagnosis not present

## 2023-06-26 DIAGNOSIS — S40012A Contusion of left shoulder, initial encounter: Secondary | ICD-10-CM | POA: Diagnosis not present

## 2023-06-26 DIAGNOSIS — M6281 Muscle weakness (generalized): Secondary | ICD-10-CM | POA: Diagnosis not present

## 2023-07-01 DIAGNOSIS — R262 Difficulty in walking, not elsewhere classified: Secondary | ICD-10-CM | POA: Diagnosis not present

## 2023-07-01 DIAGNOSIS — S40012A Contusion of left shoulder, initial encounter: Secondary | ICD-10-CM | POA: Diagnosis not present

## 2023-07-01 DIAGNOSIS — S9002XA Contusion of left ankle, initial encounter: Secondary | ICD-10-CM | POA: Diagnosis not present

## 2023-07-01 DIAGNOSIS — M6281 Muscle weakness (generalized): Secondary | ICD-10-CM | POA: Diagnosis not present

## 2023-07-03 DIAGNOSIS — R262 Difficulty in walking, not elsewhere classified: Secondary | ICD-10-CM | POA: Diagnosis not present

## 2023-07-03 DIAGNOSIS — M6281 Muscle weakness (generalized): Secondary | ICD-10-CM | POA: Diagnosis not present

## 2023-07-03 DIAGNOSIS — S9002XA Contusion of left ankle, initial encounter: Secondary | ICD-10-CM | POA: Diagnosis not present

## 2023-07-03 DIAGNOSIS — S40012A Contusion of left shoulder, initial encounter: Secondary | ICD-10-CM | POA: Diagnosis not present

## 2023-07-08 DIAGNOSIS — R262 Difficulty in walking, not elsewhere classified: Secondary | ICD-10-CM | POA: Diagnosis not present

## 2023-07-08 DIAGNOSIS — M6281 Muscle weakness (generalized): Secondary | ICD-10-CM | POA: Diagnosis not present

## 2023-07-08 DIAGNOSIS — S40012A Contusion of left shoulder, initial encounter: Secondary | ICD-10-CM | POA: Diagnosis not present

## 2023-07-08 DIAGNOSIS — S9002XA Contusion of left ankle, initial encounter: Secondary | ICD-10-CM | POA: Diagnosis not present

## 2023-07-12 DIAGNOSIS — M6281 Muscle weakness (generalized): Secondary | ICD-10-CM | POA: Diagnosis not present

## 2023-07-12 DIAGNOSIS — R262 Difficulty in walking, not elsewhere classified: Secondary | ICD-10-CM | POA: Diagnosis not present

## 2023-07-12 DIAGNOSIS — S40012A Contusion of left shoulder, initial encounter: Secondary | ICD-10-CM | POA: Diagnosis not present

## 2023-07-12 DIAGNOSIS — S9002XA Contusion of left ankle, initial encounter: Secondary | ICD-10-CM | POA: Diagnosis not present

## 2023-07-15 DIAGNOSIS — R262 Difficulty in walking, not elsewhere classified: Secondary | ICD-10-CM | POA: Diagnosis not present

## 2023-07-15 DIAGNOSIS — S9002XA Contusion of left ankle, initial encounter: Secondary | ICD-10-CM | POA: Diagnosis not present

## 2023-07-15 DIAGNOSIS — M6281 Muscle weakness (generalized): Secondary | ICD-10-CM | POA: Diagnosis not present

## 2023-07-15 DIAGNOSIS — S40012A Contusion of left shoulder, initial encounter: Secondary | ICD-10-CM | POA: Diagnosis not present

## 2023-07-17 DIAGNOSIS — S9002XA Contusion of left ankle, initial encounter: Secondary | ICD-10-CM | POA: Diagnosis not present

## 2023-07-17 DIAGNOSIS — R262 Difficulty in walking, not elsewhere classified: Secondary | ICD-10-CM | POA: Diagnosis not present

## 2023-07-17 DIAGNOSIS — M6281 Muscle weakness (generalized): Secondary | ICD-10-CM | POA: Diagnosis not present

## 2023-07-17 DIAGNOSIS — S40012A Contusion of left shoulder, initial encounter: Secondary | ICD-10-CM | POA: Diagnosis not present

## 2023-07-22 DIAGNOSIS — R262 Difficulty in walking, not elsewhere classified: Secondary | ICD-10-CM | POA: Diagnosis not present

## 2023-07-22 DIAGNOSIS — M6281 Muscle weakness (generalized): Secondary | ICD-10-CM | POA: Diagnosis not present

## 2023-07-22 DIAGNOSIS — S40012A Contusion of left shoulder, initial encounter: Secondary | ICD-10-CM | POA: Diagnosis not present

## 2023-07-22 DIAGNOSIS — S9002XA Contusion of left ankle, initial encounter: Secondary | ICD-10-CM | POA: Diagnosis not present

## 2023-07-23 DIAGNOSIS — S20212A Contusion of left front wall of thorax, initial encounter: Secondary | ICD-10-CM | POA: Diagnosis not present

## 2023-07-23 DIAGNOSIS — S40012A Contusion of left shoulder, initial encounter: Secondary | ICD-10-CM | POA: Diagnosis not present

## 2023-07-23 DIAGNOSIS — S9002XA Contusion of left ankle, initial encounter: Secondary | ICD-10-CM | POA: Diagnosis not present

## 2023-07-23 DIAGNOSIS — I10 Essential (primary) hypertension: Secondary | ICD-10-CM | POA: Diagnosis not present

## 2023-07-23 DIAGNOSIS — Z6826 Body mass index (BMI) 26.0-26.9, adult: Secondary | ICD-10-CM | POA: Diagnosis not present

## 2023-07-25 DIAGNOSIS — R262 Difficulty in walking, not elsewhere classified: Secondary | ICD-10-CM | POA: Diagnosis not present

## 2023-07-25 DIAGNOSIS — S40012A Contusion of left shoulder, initial encounter: Secondary | ICD-10-CM | POA: Diagnosis not present

## 2023-07-25 DIAGNOSIS — M6281 Muscle weakness (generalized): Secondary | ICD-10-CM | POA: Diagnosis not present

## 2023-07-25 DIAGNOSIS — S9002XA Contusion of left ankle, initial encounter: Secondary | ICD-10-CM | POA: Diagnosis not present

## 2023-07-29 DIAGNOSIS — S40012A Contusion of left shoulder, initial encounter: Secondary | ICD-10-CM | POA: Diagnosis not present

## 2023-07-29 DIAGNOSIS — S9002XA Contusion of left ankle, initial encounter: Secondary | ICD-10-CM | POA: Diagnosis not present

## 2023-07-29 DIAGNOSIS — R262 Difficulty in walking, not elsewhere classified: Secondary | ICD-10-CM | POA: Diagnosis not present

## 2023-07-29 DIAGNOSIS — M6281 Muscle weakness (generalized): Secondary | ICD-10-CM | POA: Diagnosis not present

## 2023-07-31 DIAGNOSIS — R262 Difficulty in walking, not elsewhere classified: Secondary | ICD-10-CM | POA: Diagnosis not present

## 2023-07-31 DIAGNOSIS — M6281 Muscle weakness (generalized): Secondary | ICD-10-CM | POA: Diagnosis not present

## 2023-07-31 DIAGNOSIS — S40012A Contusion of left shoulder, initial encounter: Secondary | ICD-10-CM | POA: Diagnosis not present

## 2023-07-31 DIAGNOSIS — S9002XA Contusion of left ankle, initial encounter: Secondary | ICD-10-CM | POA: Diagnosis not present

## 2023-08-05 DIAGNOSIS — R262 Difficulty in walking, not elsewhere classified: Secondary | ICD-10-CM | POA: Diagnosis not present

## 2023-08-05 DIAGNOSIS — S9002XA Contusion of left ankle, initial encounter: Secondary | ICD-10-CM | POA: Diagnosis not present

## 2023-08-05 DIAGNOSIS — S40012A Contusion of left shoulder, initial encounter: Secondary | ICD-10-CM | POA: Diagnosis not present

## 2023-08-05 DIAGNOSIS — M6281 Muscle weakness (generalized): Secondary | ICD-10-CM | POA: Diagnosis not present

## 2023-08-07 DIAGNOSIS — R262 Difficulty in walking, not elsewhere classified: Secondary | ICD-10-CM | POA: Diagnosis not present

## 2023-08-07 DIAGNOSIS — S40012A Contusion of left shoulder, initial encounter: Secondary | ICD-10-CM | POA: Diagnosis not present

## 2023-08-07 DIAGNOSIS — M6281 Muscle weakness (generalized): Secondary | ICD-10-CM | POA: Diagnosis not present

## 2023-08-07 DIAGNOSIS — S9002XA Contusion of left ankle, initial encounter: Secondary | ICD-10-CM | POA: Diagnosis not present

## 2023-08-12 DIAGNOSIS — Z6826 Body mass index (BMI) 26.0-26.9, adult: Secondary | ICD-10-CM | POA: Diagnosis not present

## 2023-08-12 DIAGNOSIS — L237 Allergic contact dermatitis due to plants, except food: Secondary | ICD-10-CM | POA: Diagnosis not present

## 2023-08-27 DIAGNOSIS — S40012A Contusion of left shoulder, initial encounter: Secondary | ICD-10-CM | POA: Diagnosis not present

## 2023-08-27 DIAGNOSIS — I10 Essential (primary) hypertension: Secondary | ICD-10-CM | POA: Diagnosis not present

## 2023-08-27 DIAGNOSIS — S9002XA Contusion of left ankle, initial encounter: Secondary | ICD-10-CM | POA: Diagnosis not present

## 2023-08-27 DIAGNOSIS — S20212A Contusion of left front wall of thorax, initial encounter: Secondary | ICD-10-CM | POA: Diagnosis not present

## 2023-08-27 DIAGNOSIS — Z6826 Body mass index (BMI) 26.0-26.9, adult: Secondary | ICD-10-CM | POA: Diagnosis not present

## 2023-09-17 DIAGNOSIS — I10 Essential (primary) hypertension: Secondary | ICD-10-CM | POA: Diagnosis not present

## 2023-11-27 DIAGNOSIS — Z6826 Body mass index (BMI) 26.0-26.9, adult: Secondary | ICD-10-CM | POA: Diagnosis not present

## 2023-11-27 DIAGNOSIS — H608X1 Other otitis externa, right ear: Secondary | ICD-10-CM | POA: Diagnosis not present

## 2023-12-05 DIAGNOSIS — H9211 Otorrhea, right ear: Secondary | ICD-10-CM | POA: Diagnosis not present

## 2023-12-05 DIAGNOSIS — H7291 Unspecified perforation of tympanic membrane, right ear: Secondary | ICD-10-CM | POA: Diagnosis not present

## 2023-12-26 DIAGNOSIS — H7291 Unspecified perforation of tympanic membrane, right ear: Secondary | ICD-10-CM | POA: Diagnosis not present

## 2024-01-06 DIAGNOSIS — Z6826 Body mass index (BMI) 26.0-26.9, adult: Secondary | ICD-10-CM | POA: Diagnosis not present

## 2024-01-06 DIAGNOSIS — R059 Cough, unspecified: Secondary | ICD-10-CM | POA: Diagnosis not present

## 2024-01-06 DIAGNOSIS — J309 Allergic rhinitis, unspecified: Secondary | ICD-10-CM | POA: Diagnosis not present

## 2024-01-21 DIAGNOSIS — H524 Presbyopia: Secondary | ICD-10-CM | POA: Diagnosis not present

## 2024-01-21 DIAGNOSIS — Z0100 Encounter for examination of eyes and vision without abnormal findings: Secondary | ICD-10-CM | POA: Diagnosis not present

## 2024-01-21 DIAGNOSIS — H40053 Ocular hypertension, bilateral: Secondary | ICD-10-CM | POA: Diagnosis not present

## 2024-03-05 DIAGNOSIS — R21 Rash and other nonspecific skin eruption: Secondary | ICD-10-CM | POA: Diagnosis not present

## 2024-03-05 DIAGNOSIS — Z6826 Body mass index (BMI) 26.0-26.9, adult: Secondary | ICD-10-CM | POA: Diagnosis not present

## 2024-03-05 DIAGNOSIS — R051 Acute cough: Secondary | ICD-10-CM | POA: Diagnosis not present
